# Patient Record
Sex: Female | Born: 1935 | Race: White | Hispanic: No | Smoking: Former smoker
Health system: Southern US, Community
[De-identification: ages and names within clinical notes are randomized; demographics above are authoritative.]

## PROBLEM LIST (undated history)

## (undated) DIAGNOSIS — E785 Hyperlipidemia, unspecified: Secondary | ICD-10-CM

## (undated) DIAGNOSIS — E079 Disorder of thyroid, unspecified: Secondary | ICD-10-CM

## (undated) DIAGNOSIS — S065XAA Traumatic subdural hemorrhage with loss of consciousness status unknown, initial encounter: Secondary | ICD-10-CM

## (undated) DIAGNOSIS — I10 Essential (primary) hypertension: Secondary | ICD-10-CM

## (undated) DIAGNOSIS — I4891 Unspecified atrial fibrillation: Secondary | ICD-10-CM

## (undated) DIAGNOSIS — M1990 Unspecified osteoarthritis, unspecified site: Secondary | ICD-10-CM

## (undated) DIAGNOSIS — S065X9A Traumatic subdural hemorrhage with loss of consciousness of unspecified duration, initial encounter: Secondary | ICD-10-CM

## (undated) HISTORY — PX: JOINT REPLACEMENT: SHX530

## (undated) HISTORY — PX: ABDOMINAL HYSTERECTOMY: SHX81

## (undated) HISTORY — DX: Essential (primary) hypertension: I10

## (undated) HISTORY — PX: TONSILLECTOMY AND ADENOIDECTOMY: SUR1326

## (undated) HISTORY — PX: THYROIDECTOMY, PARTIAL: SHX18

## (undated) HISTORY — PX: TOTAL HIP ARTHROPLASTY: SHX124

## (undated) HISTORY — DX: Unspecified osteoarthritis, unspecified site: M19.90

## (undated) HISTORY — DX: Hyperlipidemia, unspecified: E78.5

## (undated) HISTORY — DX: Disorder of thyroid, unspecified: E07.9

## (undated) HISTORY — PX: TOTAL SHOULDER ARTHROPLASTY: SHX126

## (undated) HISTORY — PX: CRANIOTOMY: SHX93

---

## 1999-11-08 ENCOUNTER — Ambulatory Visit (HOSPITAL_COMMUNITY): Admission: RE | Admit: 1999-11-08 | Discharge: 1999-11-08 | Payer: Self-pay | Admitting: Gastroenterology

## 2000-06-08 LAB — HM COLONOSCOPY

## 2004-05-18 ENCOUNTER — Encounter: Admission: RE | Admit: 2004-05-18 | Discharge: 2004-05-18 | Payer: Self-pay | Admitting: Neurological Surgery

## 2004-05-30 ENCOUNTER — Encounter: Admission: RE | Admit: 2004-05-30 | Discharge: 2004-05-30 | Payer: Self-pay | Admitting: Neurological Surgery

## 2004-06-13 ENCOUNTER — Encounter: Admission: RE | Admit: 2004-06-13 | Discharge: 2004-06-13 | Payer: Self-pay | Admitting: Neurological Surgery

## 2004-07-26 ENCOUNTER — Ambulatory Visit: Payer: Self-pay | Admitting: Internal Medicine

## 2004-12-20 ENCOUNTER — Ambulatory Visit: Payer: Self-pay | Admitting: Specialist

## 2005-09-27 ENCOUNTER — Ambulatory Visit: Payer: Self-pay | Admitting: Internal Medicine

## 2006-04-01 ENCOUNTER — Ambulatory Visit: Payer: Self-pay | Admitting: Ophthalmology

## 2006-04-08 ENCOUNTER — Ambulatory Visit: Payer: Self-pay | Admitting: Ophthalmology

## 2007-01-01 ENCOUNTER — Ambulatory Visit: Payer: Self-pay | Admitting: Internal Medicine

## 2007-01-17 ENCOUNTER — Encounter: Payer: Self-pay | Admitting: Internal Medicine

## 2007-02-01 ENCOUNTER — Emergency Department: Payer: Self-pay | Admitting: Emergency Medicine

## 2007-02-02 ENCOUNTER — Encounter: Payer: Self-pay | Admitting: Internal Medicine

## 2007-03-04 ENCOUNTER — Encounter: Payer: Self-pay | Admitting: Internal Medicine

## 2007-04-04 ENCOUNTER — Encounter: Payer: Self-pay | Admitting: Internal Medicine

## 2007-05-05 ENCOUNTER — Encounter: Payer: Self-pay | Admitting: Internal Medicine

## 2007-06-04 ENCOUNTER — Encounter: Payer: Self-pay | Admitting: Internal Medicine

## 2007-07-05 ENCOUNTER — Encounter: Payer: Self-pay | Admitting: Internal Medicine

## 2007-08-04 ENCOUNTER — Encounter: Payer: Self-pay | Admitting: Internal Medicine

## 2007-09-04 ENCOUNTER — Encounter: Payer: Self-pay | Admitting: Internal Medicine

## 2007-11-02 ENCOUNTER — Encounter: Payer: Self-pay | Admitting: Internal Medicine

## 2007-12-03 ENCOUNTER — Encounter: Payer: Self-pay | Admitting: Internal Medicine

## 2008-01-14 ENCOUNTER — Ambulatory Visit: Payer: Self-pay | Admitting: Internal Medicine

## 2008-02-21 ENCOUNTER — Emergency Department: Payer: Self-pay | Admitting: Emergency Medicine

## 2008-02-21 ENCOUNTER — Other Ambulatory Visit: Payer: Self-pay

## 2008-10-06 ENCOUNTER — Encounter: Payer: Self-pay | Admitting: Internal Medicine

## 2009-01-17 ENCOUNTER — Ambulatory Visit: Payer: Self-pay | Admitting: Internal Medicine

## 2010-01-18 ENCOUNTER — Ambulatory Visit: Payer: Self-pay | Admitting: Internal Medicine

## 2010-02-01 ENCOUNTER — Ambulatory Visit: Payer: Self-pay | Admitting: Specialist

## 2010-03-16 ENCOUNTER — Ambulatory Visit: Payer: Self-pay | Admitting: Pain Medicine

## 2010-03-28 ENCOUNTER — Ambulatory Visit: Payer: Self-pay | Admitting: Pain Medicine

## 2010-04-20 ENCOUNTER — Ambulatory Visit: Payer: Self-pay | Admitting: Pain Medicine

## 2010-10-16 ENCOUNTER — Ambulatory Visit: Payer: Self-pay | Admitting: Pain Medicine

## 2010-10-31 ENCOUNTER — Ambulatory Visit: Payer: Self-pay | Admitting: Pain Medicine

## 2010-11-20 ENCOUNTER — Ambulatory Visit: Payer: Self-pay | Admitting: Pain Medicine

## 2010-12-25 ENCOUNTER — Other Ambulatory Visit: Payer: Self-pay | Admitting: Internal Medicine

## 2010-12-26 ENCOUNTER — Ambulatory Visit: Payer: Self-pay | Admitting: Internal Medicine

## 2011-01-15 ENCOUNTER — Ambulatory Visit: Payer: Self-pay | Admitting: Unknown Physician Specialty

## 2011-01-16 ENCOUNTER — Ambulatory Visit: Payer: Self-pay | Admitting: Unknown Physician Specialty

## 2011-01-19 LAB — PATHOLOGY REPORT

## 2011-02-07 ENCOUNTER — Ambulatory Visit: Payer: Self-pay | Admitting: Internal Medicine

## 2011-12-18 ENCOUNTER — Emergency Department: Payer: Self-pay | Admitting: Emergency Medicine

## 2011-12-18 LAB — URINALYSIS, COMPLETE
Bacteria: NONE SEEN
Bilirubin,UR: NEGATIVE
Blood: NEGATIVE
Glucose,UR: NEGATIVE mg/dL (ref 0–75)
Nitrite: NEGATIVE
Ph: 5 (ref 4.5–8.0)
Protein: NEGATIVE
RBC,UR: 1 /HPF (ref 0–5)
Specific Gravity: 1.018 (ref 1.003–1.030)
Squamous Epithelial: <1
WBC UR: 1 /HPF (ref 0–5)

## 2011-12-18 LAB — CBC
HCT: 38.8 % (ref 35.0–47.0)
HGB: 12.7 g/dL (ref 12.0–16.0)
MCH: 30.9 pg (ref 26.0–34.0)
MCHC: 32.7 g/dL (ref 32.0–36.0)
MCV: 95 fL (ref 80–100)
Platelet: 196 10*3/uL (ref 150–440)
RBC: 4.1 10*6/uL (ref 3.80–5.20)
RDW: 13.2 % (ref 11.5–14.5)
WBC: 8 10*3/uL (ref 3.6–11.0)

## 2011-12-18 LAB — COMPREHENSIVE METABOLIC PANEL
Albumin: 4.3 g/dL (ref 3.4–5.0)
Alkaline Phosphatase: 73 U/L (ref 50–136)
Anion Gap: 10 (ref 7–16)
BUN: 40 mg/dL — ABNORMAL HIGH (ref 7–18)
Bilirubin,Total: 0.6 mg/dL (ref 0.2–1.0)
Calcium, Total: 9.2 mg/dL (ref 8.5–10.1)
Chloride: 104 mmol/L (ref 98–107)
Co2: 26 mmol/L (ref 21–32)
Creatinine: 0.95 mg/dL (ref 0.60–1.30)
EGFR (African American): >60
EGFR (Non-African Amer.): 55 — ABNORMAL LOW
Glucose: 85 mg/dL (ref 65–99)
Osmolality: 288 (ref 275–301)
Potassium: 3.8 mmol/L (ref 3.5–5.1)
SGOT(AST): 41 U/L — ABNORMAL HIGH (ref 15–37)
SGPT (ALT): 24 U/L
Sodium: 140 mmol/L (ref 136–145)
Total Protein: 7.4 g/dL (ref 6.4–8.2)

## 2011-12-18 LAB — PROTIME-INR
INR: 0.9
Prothrombin Time: 12.6 secs (ref 11.5–14.7)

## 2011-12-18 LAB — APTT: Activated PTT: 27.1 secs (ref 23.6–35.9)

## 2011-12-20 LAB — URINE CULTURE

## 2011-12-21 ENCOUNTER — Encounter: Payer: Self-pay | Admitting: Internal Medicine

## 2012-01-02 ENCOUNTER — Encounter: Payer: Self-pay | Admitting: Internal Medicine

## 2012-02-13 ENCOUNTER — Ambulatory Visit: Payer: Self-pay | Admitting: Internal Medicine

## 2013-02-17 ENCOUNTER — Ambulatory Visit: Payer: Self-pay | Admitting: Internal Medicine

## 2013-02-17 LAB — HM MAMMOGRAPHY: HM Mammogram: NORMAL

## 2013-09-03 HISTORY — PX: KYPHOPLASTY: SHX5884

## 2013-10-24 ENCOUNTER — Emergency Department: Payer: Self-pay | Admitting: Emergency Medicine

## 2014-01-11 DIAGNOSIS — M1990 Unspecified osteoarthritis, unspecified site: Secondary | ICD-10-CM | POA: Insufficient documentation

## 2014-01-11 DIAGNOSIS — Z8719 Personal history of other diseases of the digestive system: Secondary | ICD-10-CM | POA: Insufficient documentation

## 2014-01-11 DIAGNOSIS — J42 Unspecified chronic bronchitis: Secondary | ICD-10-CM | POA: Insufficient documentation

## 2014-01-15 ENCOUNTER — Ambulatory Visit: Payer: Self-pay | Admitting: Orthopedic Surgery

## 2014-01-15 LAB — CBC WITH DIFFERENTIAL/PLATELET
Basophil #: 0.1 10*3/uL (ref 0.0–0.1)
Basophil %: 0.5 %
Eosinophil #: 0 10*3/uL (ref 0.0–0.7)
Eosinophil %: 0.3 %
HCT: 41.1 % (ref 35.0–47.0)
HGB: 13 g/dL (ref 12.0–16.0)
Lymphocyte #: 0.6 10*3/uL — ABNORMAL LOW (ref 1.0–3.6)
Lymphocyte %: 5.2 %
MCH: 31.5 pg (ref 26.0–34.0)
MCHC: 31.7 g/dL — ABNORMAL LOW (ref 32.0–36.0)
MCV: 99 fL (ref 80–100)
Monocyte #: 0.7 x10 3/mm (ref 0.2–0.9)
Monocyte %: 5.5 %
Neutrophil #: 10.5 10*3/uL — ABNORMAL HIGH (ref 1.4–6.5)
Neutrophil %: 88.5 %
Platelet: 189 10*3/uL (ref 150–440)
RBC: 4.13 10*6/uL (ref 3.80–5.20)
RDW: 13.6 % (ref 11.5–14.5)
WBC: 11.9 10*3/uL — ABNORMAL HIGH (ref 3.6–11.0)

## 2014-01-15 LAB — BASIC METABOLIC PANEL
Anion Gap: 5 — ABNORMAL LOW (ref 7–16)
BUN: 27 mg/dL — ABNORMAL HIGH (ref 7–18)
Calcium, Total: 9.6 mg/dL (ref 8.5–10.1)
Chloride: 100 mmol/L (ref 98–107)
Co2: 32 mmol/L (ref 21–32)
Creatinine: 0.74 mg/dL (ref 0.60–1.30)
EGFR (African American): >60
EGFR (Non-African Amer.): >60
Glucose: 84 mg/dL (ref 65–99)
Osmolality: 278 (ref 275–301)
Potassium: 3.8 mmol/L (ref 3.5–5.1)
Sodium: 137 mmol/L (ref 136–145)

## 2014-01-19 LAB — PATHOLOGY REPORT

## 2014-01-25 ENCOUNTER — Emergency Department: Payer: Self-pay | Admitting: Emergency Medicine

## 2014-01-29 DIAGNOSIS — K5900 Constipation, unspecified: Secondary | ICD-10-CM | POA: Insufficient documentation

## 2014-01-29 DIAGNOSIS — Z9889 Other specified postprocedural states: Secondary | ICD-10-CM | POA: Insufficient documentation

## 2014-01-29 DIAGNOSIS — S22000A Wedge compression fracture of unspecified thoracic vertebra, initial encounter for closed fracture: Secondary | ICD-10-CM | POA: Insufficient documentation

## 2014-02-09 ENCOUNTER — Ambulatory Visit: Payer: Self-pay | Admitting: Orthopedic Surgery

## 2014-02-10 DIAGNOSIS — E039 Hypothyroidism, unspecified: Secondary | ICD-10-CM

## 2014-02-10 DIAGNOSIS — M8448XD Pathological fracture, other site, subsequent encounter for fracture with routine healing: Secondary | ICD-10-CM

## 2014-02-10 DIAGNOSIS — I10 Essential (primary) hypertension: Secondary | ICD-10-CM

## 2014-02-10 DIAGNOSIS — E785 Hyperlipidemia, unspecified: Secondary | ICD-10-CM

## 2014-02-10 DIAGNOSIS — K5900 Constipation, unspecified: Secondary | ICD-10-CM

## 2014-02-13 ENCOUNTER — Ambulatory Visit: Payer: Self-pay | Admitting: Orthopedic Surgery

## 2014-02-17 DIAGNOSIS — G4700 Insomnia, unspecified: Secondary | ICD-10-CM

## 2014-02-17 DIAGNOSIS — G8929 Other chronic pain: Secondary | ICD-10-CM

## 2014-02-17 DIAGNOSIS — Z9889 Other specified postprocedural states: Secondary | ICD-10-CM

## 2014-03-03 ENCOUNTER — Emergency Department: Payer: Self-pay | Admitting: Emergency Medicine

## 2014-03-12 ENCOUNTER — Emergency Department: Payer: Self-pay | Admitting: Emergency Medicine

## 2014-06-08 ENCOUNTER — Encounter (INDEPENDENT_AMBULATORY_CARE_PROVIDER_SITE_OTHER): Payer: Self-pay

## 2014-06-08 ENCOUNTER — Ambulatory Visit (INDEPENDENT_AMBULATORY_CARE_PROVIDER_SITE_OTHER): Payer: Medicare Other | Admitting: Internal Medicine

## 2014-06-08 ENCOUNTER — Encounter: Payer: Self-pay | Admitting: Internal Medicine

## 2014-06-08 VITALS — BP 132/82 | HR 82 | Temp 98.0°F | Resp 14 | Ht 60.5 in | Wt 97.5 lb

## 2014-06-08 DIAGNOSIS — G450 Vertebro-basilar artery syndrome: Secondary | ICD-10-CM | POA: Insufficient documentation

## 2014-06-08 DIAGNOSIS — Z23 Encounter for immunization: Secondary | ICD-10-CM

## 2014-06-08 DIAGNOSIS — R591 Generalized enlarged lymph nodes: Secondary | ICD-10-CM | POA: Insufficient documentation

## 2014-06-08 DIAGNOSIS — S065X9A Traumatic subdural hemorrhage with loss of consciousness of unspecified duration, initial encounter: Secondary | ICD-10-CM | POA: Insufficient documentation

## 2014-06-08 DIAGNOSIS — I10 Essential (primary) hypertension: Secondary | ICD-10-CM | POA: Insufficient documentation

## 2014-06-08 DIAGNOSIS — R0681 Apnea, not elsewhere classified: Secondary | ICD-10-CM | POA: Insufficient documentation

## 2014-06-08 DIAGNOSIS — R5383 Other fatigue: Secondary | ICD-10-CM

## 2014-06-08 DIAGNOSIS — R002 Palpitations: Secondary | ICD-10-CM

## 2014-06-08 DIAGNOSIS — E038 Other specified hypothyroidism: Secondary | ICD-10-CM

## 2014-06-08 DIAGNOSIS — E785 Hyperlipidemia, unspecified: Secondary | ICD-10-CM | POA: Insufficient documentation

## 2014-06-08 DIAGNOSIS — I4891 Unspecified atrial fibrillation: Secondary | ICD-10-CM | POA: Insufficient documentation

## 2014-06-08 DIAGNOSIS — M810 Age-related osteoporosis without current pathological fracture: Secondary | ICD-10-CM | POA: Insufficient documentation

## 2014-06-08 DIAGNOSIS — R599 Enlarged lymph nodes, unspecified: Secondary | ICD-10-CM | POA: Insufficient documentation

## 2014-06-08 DIAGNOSIS — J309 Allergic rhinitis, unspecified: Secondary | ICD-10-CM | POA: Insufficient documentation

## 2014-06-08 DIAGNOSIS — Z79899 Other long term (current) drug therapy: Secondary | ICD-10-CM

## 2014-06-08 DIAGNOSIS — S065XAA Traumatic subdural hemorrhage with loss of consciousness status unknown, initial encounter: Secondary | ICD-10-CM | POA: Insufficient documentation

## 2014-06-08 DIAGNOSIS — R64 Cachexia: Secondary | ICD-10-CM

## 2014-06-08 DIAGNOSIS — S065X0D Traumatic subdural hemorrhage without loss of consciousness, subsequent encounter: Secondary | ICD-10-CM

## 2014-06-08 NOTE — Patient Instructions (Signed)
It was nice to meet you today.  We will set up an evaluation with Dr. Gwen PoundsKowalski in cardiology.  We will check blood work today.  Follow up in 2 weeks.

## 2014-06-08 NOTE — Assessment & Plan Note (Signed)
Will check CMP, CBC, TSH with labs today. EKG normal today. Will set up cardiology evaluation with Dr. Gwen PoundsKowalski for possible Holter. Question if she may be having runs of AFIB.

## 2014-06-08 NOTE — Assessment & Plan Note (Signed)
Recent weight loss and apathy. Will check TSH with labs today. Continue Levothyroxine.

## 2014-06-08 NOTE — Assessment & Plan Note (Signed)
BP Readings from Last 3 Encounters:  06/08/14 132/82   BP well controlled on current medication. Renal function with labs today.

## 2014-06-08 NOTE — Assessment & Plan Note (Signed)
Recent fatigue and apathy. Question if depression may be playing a role, however she denies this. Will check CBC, CMP, TSH, B12 with labs. Follow up in 2-4 weeks.

## 2014-06-08 NOTE — Assessment & Plan Note (Signed)
Reviewed history of SDH with pt today. No recent falls and falls prevention in place in her home. Will continue to monitor.

## 2014-06-08 NOTE — Progress Notes (Signed)
Pre visit review using our clinic review tool, if applicable. No additional management support is needed unless otherwise documented below in the visit note. 

## 2014-06-08 NOTE — Assessment & Plan Note (Signed)
Recent weight loss noted by family. Will check CBC, TSH, CMP with labs. Suspect depression playing a role, however pt denies this. Will have her follow up in 2 weeks.

## 2014-06-08 NOTE — Progress Notes (Signed)
Subjective:    Patient ID: Elizabeth Horne, female    DOB: 25-Oct-1925, 80 y.o.   MRN: [id number]  HPI 80YO female presents to establish care.  Difficult time for her recently. Had kyphoplasty this summer. Has been having 10 hours of assistance daily with ADLs. Lives in [place] at [employer]. Has not left home since July. Denies any pain in her back after her procedures. Denies sadness. However notes lack of interest in usual activities. Concerned about risk of falling and reports that this fear limits her from wanting to leave home.. No recent falls. Falls prevention in place at home including safety bars, removal of rugs. Poor appetite this summer. Appetite improving. Prior to surgery this summer, normal weight 110-112.  Had a SDH several years ago. Had to have surgical intervention with craniotomy. No residual sequelae from this.  Palpitations - having some palpitations and fluttering with exertion over last few months. Mild shortness of breath during these episodes. No chest pain. Lasts a few minutes then gone without intervention.  Review of Systems  Constitutional: Positive for activity change and appetite change. Negative for fever, chills, fatigue and unexpected weight change.  Eyes: Negative for visual disturbance.  Respiratory: Positive for shortness of breath.   Cardiovascular: Positive for palpitations. Negative for chest pain and leg swelling.  Gastrointestinal: Negative for nausea, vomiting, abdominal pain, diarrhea and constipation.  Skin: Negative for color change and rash.  Hematological: Negative for adenopathy. Does not bruise/bleed easily.  Psychiatric/Behavioral: Negative for sleep disturbance, dysphoric mood and decreased concentration. The patient is not nervous/anxious.        Objective:    BP 132/82  Pulse 82  Temp(Src) 98 F (36.7 C) (Oral)  Resp 14  Ht 5' 0.5" (1.537 m)  Wt 97 lb 8 oz (44.226 kg)  BMI 18.72 kg/m2  SpO2 95% Physical  Exam  Constitutional: She is oriented to person, place, and time. She appears well-developed and well-nourished. No distress.  HENT:  Head: Normocephalic and atraumatic.  Right Ear: External ear normal.  Left Ear: External ear normal.  Nose: Nose normal.  Mouth/Throat: Oropharynx is clear and moist. No oropharyngeal exudate.  Eyes: Conjunctivae and EOM are normal. Pupils are equal, round, and reactive to light. Right eye exhibits no discharge. Left eye exhibits no discharge. No scleral icterus.  Neck: Normal range of motion. Neck supple. No tracheal deviation present. No thyromegaly present.  Cardiovascular: Normal rate, regular rhythm, normal heart sounds and intact distal pulses.  Exam reveals no gallop and no friction rub.   No murmur heard. Pulmonary/Chest: Effort normal and breath sounds normal. No accessory muscle usage. Not tachypneic. No respiratory distress. She has no decreased breath sounds. She has no wheezes. She has no rhonchi. She has no rales. She exhibits no tenderness.  Abdominal: Soft. Bowel sounds are normal. She exhibits no distension and no mass. There is no tenderness. There is no rebound and no guarding.  Musculoskeletal: Normal range of motion. She exhibits no edema and no tenderness.  Lymphadenopathy:    She has no cervical adenopathy.  Neurological: She is alert and oriented to person, place, and time. No cranial nerve deficit. She exhibits normal muscle tone. Coordination normal.  Skin: Skin is warm and dry. No rash noted. She is not diaphoretic. No erythema. No pallor.  Psychiatric: She has a normal mood and affect. Her behavior is normal. Judgment and thought content normal.          Assessment & Plan:  Problem List Items Addressed This Visit     Unprioritized   BP (high blood pressure) - Primary      BP Readings from Last 3 Encounters:  06/08/14 132/82   BP well controlled on current medication. Renal function with labs today.    Relevant  Medications      aspirin 81 MG tablet      losartan-hydrochlorothiazide (HYZAAR) 50-12.5 MG per tablet      simvastatin (ZOCOR) 40 MG tablet   Other Relevant Orders      Comprehensive metabolic panel      Lipid Profile   Cachexia     Recent weight loss noted by family. Will check CBC, TSH, CMP with labs. Suspect depression playing a role, however pt denies this. Will have her follow up in 2 weeks.    Hypothyroidism, secondary     Recent weight loss and apathy. Will check TSH with labs today. Continue Levothyroxine.    Relevant Medications      levothyroxine (SYNTHROID, LEVOTHROID) 75 MCG tablet   Other Relevant Orders      TSH   Intracranial subdural hematoma     Reviewed history of SDH with pt today. No recent falls and falls prevention in place in her home. Will continue to monitor.    Other fatigue     Recent fatigue and apathy. Question if depression may be playing a role, however she denies this. Will check CBC, CMP, TSH, B12 with labs. Follow up in 2-4 weeks.    Relevant Orders      B12   Palpitations     Will check CMP, CBC, TSH with labs today. EKG normal today. Will set up cardiology evaluation with Dr. Gwen PoundsKowalski for possible Holter. Question if she may be having runs of AFIB.    Relevant Orders      EKG 12-Lead (Completed)      Ambulatory referral to Cardiology    Other Visit Diagnoses   Need for prophylactic vaccination against Streptococcus pneumoniae (pneumococcus)        Relevant Orders       Pneumococcal conjugate vaccine 13-valent (Completed)    Encounter for immunization            Return in about 2 weeks (around 06/22/2014) for Recheck.

## 2014-06-09 ENCOUNTER — Other Ambulatory Visit: Payer: Self-pay | Admitting: Internal Medicine

## 2014-06-09 ENCOUNTER — Telehealth: Payer: Self-pay | Admitting: Family Medicine

## 2014-06-09 LAB — COMPREHENSIVE METABOLIC PANEL
ALT: 15 U/L (ref 0–35)
AST: 32 U/L (ref 0–37)
Albumin: 3.6 g/dL (ref 3.5–5.2)
Alkaline Phosphatase: 63 U/L (ref 39–117)
BUN: 25 mg/dL — ABNORMAL HIGH (ref 6–23)
CO2: 27 mEq/L (ref 19–32)
Calcium: 9.5 mg/dL (ref 8.4–10.5)
Chloride: 103 mEq/L (ref 96–112)
Creatinine, Ser: 1.1 mg/dL (ref 0.4–1.2)
GFR: 52.82 mL/min — ABNORMAL LOW (ref >60.00)
Glucose, Bld: 82 mg/dL (ref 70–99)
Potassium: 5 mEq/L (ref 3.5–5.1)
Sodium: 140 mEq/L (ref 135–145)
Total Bilirubin: 0.5 mg/dL (ref 0.2–1.2)
Total Protein: 6.8 g/dL (ref 6.0–8.3)

## 2014-06-09 LAB — LIPID PANEL
Cholesterol: 155 mg/dL (ref 0–200)
HDL: 76.7 mg/dL (ref >39.00)
LDL Cholesterol: 69 mg/dL (ref 0–99)
NonHDL: 78.3
Total CHOL/HDL Ratio: 2
Triglycerides: 49 mg/dL (ref 0.0–149.0)
VLDL: 9.8 mg/dL (ref 0.0–40.0)

## 2014-06-09 LAB — VITAMIN B12: Vitamin B-12: 545 pg/mL (ref 211–911)

## 2014-06-09 LAB — TSH: TSH: 0.5 u[IU]/mL (ref 0.35–4.50)

## 2014-06-09 NOTE — Telephone Encounter (Signed)
emmi mailed  °

## 2014-06-10 ENCOUNTER — Encounter: Payer: Self-pay | Admitting: *Deleted

## 2014-07-07 ENCOUNTER — Ambulatory Visit (INDEPENDENT_AMBULATORY_CARE_PROVIDER_SITE_OTHER): Payer: Medicare Other | Admitting: Internal Medicine

## 2014-07-07 ENCOUNTER — Encounter: Payer: Self-pay | Admitting: Internal Medicine

## 2014-07-07 VITALS — BP 142/92 | HR 72 | Temp 97.9°F | Ht 60.5 in | Wt 96.0 lb

## 2014-07-07 DIAGNOSIS — F4323 Adjustment disorder with mixed anxiety and depressed mood: Secondary | ICD-10-CM | POA: Insufficient documentation

## 2014-07-07 DIAGNOSIS — E538 Deficiency of other specified B group vitamins: Secondary | ICD-10-CM

## 2014-07-07 DIAGNOSIS — R64 Cachexia: Secondary | ICD-10-CM

## 2014-07-07 MED ORDER — CYANOCOBALAMIN 1000 MCG/ML IJ SOLN
1000.0000 ug | Freq: Once | INTRAMUSCULAR | Status: AC
  Administered 2014-07-07: 1000 ug via INTRAMUSCULAR

## 2014-07-07 MED ORDER — MIRTAZAPINE 7.5 MG PO TABS: 7.5000 mg | ORAL_TABLET | Freq: Every day | ORAL | Status: DC

## 2014-07-07 NOTE — Progress Notes (Signed)
Subjective:    Patient ID: Elizabeth Horne, female    DOB: November 08, 1925, 80 y.o.   MRN: [id number]  HPI 80YO female presents for follow up.  Initially presented to establish care 06/08/2014.  Was concerned about weight loss. Labs including thyroid function, B12, CMP were normal. Family was concerned about depression, however pt denied symptoms.  Wt Readings from Last 3 Encounters:  07/07/14 96 lb (43.545 kg)  06/08/14 97 lb 8 oz (44.226 kg)   Pt reports good appetite. Energy level is low. Breakfast - egg with toast or cereal Lunch - sandwich, carrots, corn, salad Dinner - meat with veggies Rarely uses Boost or Ensure.  Caregiver notes that she sometimes seems down or depressed. Pt denies feeling sad, however it has been a difficult summer after her surgery. She is sleeping well. No NVD.   Review of Systems  Constitutional: Negative for fever, chills, appetite change, fatigue and unexpected weight change.  HENT: Negative for trouble swallowing and voice change.   Eyes: Negative for visual disturbance.  Respiratory: Negative for shortness of breath.   Cardiovascular: Negative for chest pain and leg swelling.  Gastrointestinal: Negative for nausea, vomiting, abdominal pain, diarrhea, constipation and blood in stool.  Skin: Negative for color change and rash.  Hematological: Negative for adenopathy. Does not bruise/bleed easily.  Psychiatric/Behavioral: Positive for dysphoric mood. Negative for sleep disturbance. The patient is not nervous/anxious.        Objective:    BP 142/92 mmHg  Pulse 72  Temp(Src) 97.9 F (36.6 C) (Oral)  Ht 5' 0.5" (1.537 m)  Wt 96 lb (43.545 kg)  BMI 18.43 kg/m2  SpO2 92% Physical Exam  Constitutional: She is oriented to person, place, and time. She appears well-developed and well-nourished. No distress.  HENT:  Head: Normocephalic and atraumatic.  Right Ear: External ear normal.  Left Ear: External ear normal.  Nose: Nose normal.    Mouth/Throat: Oropharynx is clear and moist. No oropharyngeal exudate.  Eyes: Conjunctivae are normal. Pupils are equal, round, and reactive to light. Right eye exhibits no discharge. Left eye exhibits no discharge. No scleral icterus.  Neck: Normal range of motion. Neck supple. No tracheal deviation present. No thyromegaly present.  Cardiovascular: Normal rate, regular rhythm, normal heart sounds and intact distal pulses.  Exam reveals no gallop and no friction rub.   No murmur heard. Pulmonary/Chest: Effort normal and breath sounds normal. No accessory muscle usage. No tachypnea. No respiratory distress. She has no decreased breath sounds. She has no wheezes. She has no rhonchi. She has no rales. She exhibits no tenderness.  Musculoskeletal: Normal range of motion. She exhibits no edema or tenderness.  Lymphadenopathy:    She has no cervical adenopathy.  Neurological: She is alert and oriented to person, place, and time. No cranial nerve deficit. She exhibits normal muscle tone. Coordination normal.  Skin: Skin is warm and dry. No rash noted. She is not diaphoretic. No erythema. No pallor.  Psychiatric: She has a normal mood and affect. Her behavior is normal. Judgment and thought content normal.          Assessment & Plan:   Problem List Items Addressed This Visit      Unprioritized   Adjustment disorder with mixed anxiety and depressed mood    Difficult time for pt recently after surgery this summer. Isolated in her home since July. She denies depression, however caregivers and family have noted apathy and depressed mood. Will start Mirtazapine to help with depressed mood  and also with appetite. Follow up in 4 weeks and prn.    Cachexia - Primary    Wt Readings from Last 3 Encounters:  07/07/14 96 lb (43.545 kg)  06/08/14 97 lb 8 oz (44.226 kg)   Recent labs normal. Encouraged high calorie diet. Suspect depression playing a role. Will start Mirtazapine. Follow up in 4 weeks and  as needed.    Relevant Medications      Mirtazapine (REMERON) tablet       Return in about 4 weeks (around 08/04/2014).

## 2014-07-07 NOTE — Assessment & Plan Note (Signed)
Wt Readings from Last 3 Encounters:  07/07/14 96 lb (43.545 kg)  06/08/14 97 lb 8 oz (44.226 kg)   Recent labs normal. Encouraged high calorie diet. Suspect depression playing a role. Will start Mirtazapine. Follow up in 4 weeks and as needed.

## 2014-07-07 NOTE — Progress Notes (Signed)
Pre visit review using our clinic review tool, if applicable. No additional management support is needed unless otherwise documented below in the visit note. 

## 2014-07-07 NOTE — Addendum Note (Signed)
Addended by: Marchia MeiersEASTWOOD, Osha Rane M on: 07/07/2014 12:09 PM   Modules accepted: Orders

## 2014-07-07 NOTE — Assessment & Plan Note (Signed)
Difficult time for pt recently after surgery this summer. Isolated in her home since July. She denies depression, however caregivers and family have noted apathy and depressed mood. Will start Mirtazapine to help with depressed mood and also with appetite. Follow up in 4 weeks and prn.

## 2014-07-07 NOTE — Patient Instructions (Addendum)
Start Mirtazapine 7.5mg  daily at bedtime to help with appetite.  Follow up in 4 weeks or sooner as needed.

## 2014-08-09 ENCOUNTER — Telehealth: Payer: Self-pay | Admitting: *Deleted

## 2014-08-09 NOTE — Telephone Encounter (Signed)
That is fine 

## 2014-08-09 NOTE — Telephone Encounter (Signed)
Pt is wanting Pharmacy to give B12 injections to her.  This okay?

## 2014-08-18 ENCOUNTER — Telehealth: Payer: Self-pay | Admitting: *Deleted

## 2014-08-18 NOTE — Telephone Encounter (Signed)
Pt daughter-in-law called to let you know that her and her husband, Raiford NobleRick, are very concerned about her short term memory.  They tell her things often and a few min later she is asking the same questions, they are concerned about early dementia.  Pt has appt tomorrow

## 2014-08-19 ENCOUNTER — Ambulatory Visit (INDEPENDENT_AMBULATORY_CARE_PROVIDER_SITE_OTHER): Payer: Medicare Other | Admitting: Internal Medicine

## 2014-08-19 ENCOUNTER — Encounter: Payer: Self-pay | Admitting: Internal Medicine

## 2014-08-19 VITALS — BP 142/88 | HR 79 | Temp 98.1°F | Resp 14 | Ht 60.5 in | Wt 100.2 lb

## 2014-08-19 DIAGNOSIS — I10 Essential (primary) hypertension: Secondary | ICD-10-CM

## 2014-08-19 DIAGNOSIS — R413 Other amnesia: Secondary | ICD-10-CM

## 2014-08-19 DIAGNOSIS — R64 Cachexia: Secondary | ICD-10-CM

## 2014-08-19 NOTE — Progress Notes (Signed)
Subjective:    Patient ID: Elizabeth Horne, female    DOB: [birth year], 80 y.o.   MRN: [id number]  HPI 80YO female presents for follow up.  Last visit 07/07/2014 started Mirtazapine to help with depression and appetite. Family called yesterday, and have been concerned about memory loss and dementia.  Wt Readings from Last 3 Encounters:  08/19/14 100 lb 4 oz (45.473 kg)  07/07/14 96 lb (43.545 kg)  06/08/14 97 lb 8 oz (44.226 kg)   Energy level much improved over last few weeks. Appetite improved. Bowels regular. Continues to have intermittent episodes of lightheadedness on occasion, but feels that this is improving. No headache. No nausea or vomiting.  Took medicine right before visit. No chest pain, palpitations.   BP Readings from Last 3 Encounters:  08/19/14 142/88  07/07/14 142/92  06/08/14 132/82   Memory - feels that it is not "quite as sharp." However, manages finances at home. She is not interested in further testing of her memory or medication to slow memory loss at this point.   Past medical, surgical, family and social history per today's encounter.  Review of Systems  Constitutional: Negative for fever, chills, appetite change, fatigue and unexpected weight change.  Eyes: Negative for visual disturbance.  Respiratory: Negative for shortness of breath.   Cardiovascular: Negative for chest pain and leg swelling.  Gastrointestinal: Negative for nausea, vomiting, abdominal pain, diarrhea and constipation.  Musculoskeletal: Negative for myalgias and arthralgias.  Skin: Negative for color change and rash.  Neurological: Positive for light-headedness (intermittent). Negative for syncope and weakness.  Hematological: Negative for adenopathy. Does not bruise/bleed easily.  Psychiatric/Behavioral: Negative for sleep disturbance and dysphoric mood. The patient is not nervous/anxious.        Objective:    BP 142/88 mmHg  Pulse 79  Temp(Src) 98.1 F (36.7 C) (Oral)   Resp 14  Ht 5' 0.5" (1.537 m)  Wt 100 lb 4 oz (45.473 kg)  BMI 19.25 kg/m2  SpO2 94% Physical Exam  Constitutional: She is oriented to person, place, and time. She appears well-developed and well-nourished. No distress.  HENT:  Head: Normocephalic and atraumatic.  Right Ear: External ear normal.  Left Ear: External ear normal.  Nose: Nose normal.  Mouth/Throat: Oropharynx is clear and moist. No oropharyngeal exudate.  Eyes: Conjunctivae are normal. Pupils are equal, round, and reactive to light. Right eye exhibits no discharge. Left eye exhibits no discharge. No scleral icterus.  Neck: Normal range of motion. Neck supple. No tracheal deviation present. No thyromegaly present.  Cardiovascular: Normal rate, regular rhythm, normal heart sounds and intact distal pulses.  Exam reveals no gallop and no friction rub.   No murmur heard. Pulmonary/Chest: Effort normal and breath sounds normal. No accessory muscle usage. No tachypnea. No respiratory distress. She has no decreased breath sounds. She has no wheezes. She has no rhonchi. She has no rales. She exhibits no tenderness.  Musculoskeletal: Normal range of motion. She exhibits no edema or tenderness.  Lymphadenopathy:    She has no cervical adenopathy.  Neurological: She is alert and oriented to person, place, and time. No cranial nerve deficit. She exhibits normal muscle tone. Coordination normal.  Skin: Skin is warm and dry. No rash noted. She is not diaphoretic. No erythema. No pallor.  Psychiatric: She has a normal mood and affect. Her behavior is normal. Judgment and thought content normal.          Assessment & Plan:   Problem List Items Addressed This  Visit      Unprioritized   BP (high blood pressure)    BP Readings from Last 3 Encounters:  08/19/14 142/88  07/07/14 142/92  06/08/14 132/82   BP elevated initially today. Will continue to monitor for now. Continue Losartan-HCTZ. Repeat renal function with labs in 3  months.    Cachexia    Wt Readings from Last 3 Encounters:  08/19/14 100 lb 4 oz (45.473 kg)  07/07/14 96 lb (43.545 kg)  06/08/14 97 lb 8 oz (44.226 kg)   Body mass index is 19.25 kg/(m^2). Weight gain is encouraging. Pt reports appetite is improving. Will continue to monitor with follow up in 3 months.    Memory loss - Primary    Pt notes some short term memory loss. We discussed further testing of this with evaluation with cognitive testing. She is not interested in pursing this. We also discussed medication to slow memory loss. She does not want to add medications at this point. Note that TSH and B12 were normal.        Return in about 3 months (around 11/18/2014) for Recheck.

## 2014-08-19 NOTE — Patient Instructions (Signed)
Follow up in 3 months or sooner as needed. 

## 2014-08-19 NOTE — Assessment & Plan Note (Signed)
Pt notes some short term memory loss. We discussed further testing of this with evaluation with cognitive testing. She is not interested in pursing this. We also discussed medication to slow memory loss. She does not want to add medications at this point. Note that TSH and B12 were normal.

## 2014-08-19 NOTE — Assessment & Plan Note (Signed)
BP Readings from Last 3 Encounters:  08/19/14 142/88  07/07/14 142/92  06/08/14 132/82   BP elevated initially today. Will continue to monitor for now. Continue Losartan-HCTZ. Repeat renal function with labs in 3 months.

## 2014-08-19 NOTE — Progress Notes (Signed)
Pre visit review using our clinic review tool, if applicable. No additional management support is needed unless otherwise documented below in the visit note. 

## 2014-08-19 NOTE — Assessment & Plan Note (Signed)
Wt Readings from Last 3 Encounters:  08/19/14 100 lb 4 oz (45.473 kg)  07/07/14 96 lb (43.545 kg)  06/08/14 97 lb 8 oz (44.226 kg)   Body mass index is 19.25 kg/(m^2). Weight gain is encouraging. Pt reports appetite is improving. Will continue to monitor with follow up in 3 months.

## 2014-08-20 ENCOUNTER — Ambulatory Visit: Payer: Medicare Other | Admitting: Internal Medicine

## 2014-08-30 ENCOUNTER — Other Ambulatory Visit: Payer: Self-pay | Admitting: Internal Medicine

## 2014-09-07 ENCOUNTER — Other Ambulatory Visit: Payer: Self-pay | Admitting: Internal Medicine

## 2014-09-07 NOTE — Telephone Encounter (Signed)
Please advise Rx.  Appears to be a new Rx.

## 2014-11-18 ENCOUNTER — Ambulatory Visit: Payer: Medicare Other | Admitting: Internal Medicine

## 2014-11-18 ENCOUNTER — Encounter: Payer: Self-pay | Admitting: Internal Medicine

## 2014-11-18 ENCOUNTER — Ambulatory Visit (INDEPENDENT_AMBULATORY_CARE_PROVIDER_SITE_OTHER): Payer: Self-pay | Admitting: Internal Medicine

## 2014-11-18 VITALS — BP 126/72 | HR 81 | Temp 98.1°F | Ht 60.5 in | Wt 102.5 lb

## 2014-11-18 DIAGNOSIS — E038 Other specified hypothyroidism: Secondary | ICD-10-CM

## 2014-11-18 DIAGNOSIS — R64 Cachexia: Secondary | ICD-10-CM

## 2014-11-18 DIAGNOSIS — E785 Hyperlipidemia, unspecified: Secondary | ICD-10-CM

## 2014-11-18 DIAGNOSIS — I10 Essential (primary) hypertension: Secondary | ICD-10-CM

## 2014-11-18 LAB — COMPREHENSIVE METABOLIC PANEL
ALT: 12 U/L (ref 0–35)
AST: 25 U/L (ref 0–37)
Albumin: 4.4 g/dL (ref 3.5–5.2)
Alkaline Phosphatase: 74 U/L (ref 39–117)
BUN: 39 mg/dL — ABNORMAL HIGH (ref 6–23)
CO2: 33 mEq/L — ABNORMAL HIGH (ref 19–32)
Calcium: 9.7 mg/dL (ref 8.4–10.5)
Chloride: 101 mEq/L (ref 96–112)
Creatinine, Ser: 1.18 mg/dL (ref 0.40–1.20)
GFR: 46.12 mL/min — ABNORMAL LOW (ref >60.00)
Glucose, Bld: 106 mg/dL — ABNORMAL HIGH (ref 70–99)
Potassium: 4.8 mEq/L (ref 3.5–5.1)
Sodium: 138 mEq/L (ref 135–145)
Total Bilirubin: 0.6 mg/dL (ref 0.2–1.2)
Total Protein: 6.7 g/dL (ref 6.0–8.3)

## 2014-11-18 NOTE — Progress Notes (Signed)
Pre visit review using our clinic review tool, if applicable. No additional management support is needed unless otherwise documented below in the visit note. 

## 2014-11-18 NOTE — Assessment & Plan Note (Signed)
Will check TSH with labs. Continue Levothyroxine. 

## 2014-11-18 NOTE — Progress Notes (Signed)
Subjective:    Patient ID: Elizabeth Horne, female    DOB: 1926/03/19, 81 y.o.   MRN: [id number]  HPI  81YO female presents for follow up.  Feeling well. Caregiver reports she has excellent appetite, eating well rounded meals with vegetables including spinach and kale, meat.   Her energy level has been good. She denies any concerns today. She is compliant with her medications.  Wt Readings from Last 3 Encounters:  11/18/14 102 lb 8 oz (46.494 kg)  08/19/14 100 lb 4 oz (45.473 kg)  07/07/14 96 lb (43.545 kg)    Past medical, surgical, family and social history per today's encounter.  Review of Systems  Constitutional: Negative for fever, chills, appetite change, fatigue and unexpected weight change.  HENT: Negative for congestion, postnasal drip, rhinorrhea, trouble swallowing and voice change.   Eyes: Negative for visual disturbance.  Respiratory: Negative for shortness of breath.   Cardiovascular: Negative for chest pain and leg swelling.  Gastrointestinal: Negative for nausea, vomiting, abdominal pain, diarrhea, constipation and blood in stool.  Musculoskeletal: Negative for myalgias and arthralgias.  Skin: Negative for color change and rash.  Neurological: Negative for dizziness, weakness, numbness and headaches.  Hematological: Negative for adenopathy. Does not bruise/bleed easily.  Psychiatric/Behavioral: Negative for sleep disturbance and dysphoric mood. The patient is not nervous/anxious.        Objective:    BP 126/72 mmHg  Pulse 81  Temp(Src) 98.1 F (36.7 C) (Oral)  Ht 5' 0.5" (1.537 m)  Wt 102 lb 8 oz (46.494 kg)  BMI 19.68 kg/m2  SpO2 96% Physical Exam  Constitutional: She is oriented to person, place, and time. She appears well-developed and well-nourished. No distress.  HENT:  Head: Normocephalic and atraumatic.  Right Ear: External ear normal.  Left Ear: External ear normal.  Nose: Nose normal.  Mouth/Throat: Oropharynx is clear and moist. No  oropharyngeal exudate.  Eyes: Conjunctivae are normal. Pupils are equal, round, and reactive to light. Right eye exhibits no discharge. Left eye exhibits no discharge. No scleral icterus.  Neck: Normal range of motion. Neck supple. No tracheal deviation present. No thyromegaly present.  Cardiovascular: Normal rate, regular rhythm, normal heart sounds and intact distal pulses.  Exam reveals no gallop and no friction rub.   No murmur heard. Pulmonary/Chest: Effort normal and breath sounds normal. No respiratory distress. She has no wheezes. She has no rales. She exhibits no tenderness.  Musculoskeletal: Normal range of motion. She exhibits no edema or tenderness.  Lymphadenopathy:    She has no cervical adenopathy.  Neurological: She is alert and oriented to person, place, and time. No cranial nerve deficit. She exhibits normal muscle tone. Coordination normal.  Skin: Skin is warm and dry. No rash noted. She is not diaphoretic. No erythema. No pallor.  Psychiatric: She has a normal mood and affect. Her behavior is normal. Judgment and thought content normal.          Assessment & Plan:   Problem List Items Addressed This Visit      Unprioritized   BP (high blood pressure) - Primary    BP Readings from Last 3 Encounters:  11/18/14 126/72  08/19/14 142/88  07/07/14 142/92   BP well controlled. Renal function with labs. Continue Losartant-HCTZ.      Relevant Orders   Comprehensive metabolic panel   Cachexia    Wt Readings from Last 3 Encounters:  11/18/14 102 lb 8 oz (46.494 kg)  08/19/14 100 lb 4 oz (45.473 kg)  07/07/14 96 lb (43.545 kg)   Weight is improving. Encouraged healthy, well-rounded diet and adequate intake of dietary calcium. Follow up in 6 months and prn.      HLD (hyperlipidemia)    Will check LFTs with labs. Continue Simvastatin.      Hypothyroidism, secondary    Will check TSH with labs. Continue Levothyroxine.      Relevant Orders   TSH        Return in about 6 months (around 05/21/2015) for Wellness Visit.

## 2014-11-18 NOTE — Assessment & Plan Note (Signed)
Will check LFTs with labs. Continue Simvastatin. 

## 2014-11-18 NOTE — Patient Instructions (Addendum)
Labs today.  Follow up in 6 months. 

## 2014-11-18 NOTE — Assessment & Plan Note (Signed)
BP Readings from Last 3 Encounters:  11/18/14 126/72  08/19/14 142/88  07/07/14 142/92   BP well controlled. Renal function with labs. Continue Losartant-HCTZ.

## 2014-11-18 NOTE — Assessment & Plan Note (Signed)
Wt Readings from Last 3 Encounters:  11/18/14 102 lb 8 oz (46.494 kg)  08/19/14 100 lb 4 oz (45.473 kg)  07/07/14 96 lb (43.545 kg)   Weight is improving. Encouraged healthy, well-rounded diet and adequate intake of dietary calcium. Follow up in 6 months and prn.

## 2014-11-19 ENCOUNTER — Encounter: Payer: Self-pay | Admitting: *Deleted

## 2014-11-19 LAB — TSH: TSH: 2.63 u[IU]/mL (ref 0.35–4.50)

## 2014-12-25 NOTE — Op Note (Signed)
PATIENT NAME:  Elizabeth Horne, Elizabeth Horne MR#:  [id number] DATE OF BIRTH:  1926/05/20  DATE OF PROCEDURE:  02/09/2014  PREOPERATIVE DIAGNOSIS: T8 compression fracture.   POSTOPERATIVE DIAGNOSIS: T8 compression fracture.   PROCEDURE: Kyphoplasty T8.   ANESTHESIA: MAC.   SURGEON: Leitha SchullerMichael J. Eldrige Pitkin, MD   DESCRIPTION OF PROCEDURE: The patient was brought to the operating room and after adequate anesthesia was obtained, the patient was placed in a prone position. The C-arm was brought in and good visualization of T8 was obtained. Patient identification and timeout procedures were completed, and 10 mL of 1% Xylocaine was infiltrated in the area of the planned incisions. The back was then prepped and draped in the usual sterile fashion. Repeat timeout procedure carried out. Spinal needle was then used to inject down to the facet joint on the right initially. A small stab incision was made and trocar used to get into the vertebral body. Drilling was carried out under fluoroscopy, as there was significant compression with vertebra plana that had progressed since her preop x-rays on 01/25/2014. A balloon was inserted and inflated to 2.5 mL. Cement was mixed and then the balloon was removed. There had been partial correction of her deformity. The cement was inserted, with approximately 2.5 mL placed with good interdigitation and good fill. Postprocedure x-rays were obtained. The wound was closed with Dermabond and then covered with a Band-Aid. The patient was sent to the recovery room in stable condition. No biopsy was obtained, as there had been a recent MRI with normal signal in the vertebral body.   SPECIMEN: None.   COMPLICATIONS: None.  ESTIMATED BLOOD LOSS: 25 mL.   CONDITION: To recovery room stable.    ____________________________ Leitha SchullerMichael J. Grady Lucci, MD mjm:jcm D: 02/09/2014 20:12:15 ET T: 02/09/2014 21:48:38 ET JOB#: [id number]  cc: Leitha SchullerMichael J. Garnett Rekowski, MD, <Dictator> Leitha SchullerMICHAEL J Lukus Binion MD ELECTRONICALLY SIGNED  02/10/2014 8:10

## 2014-12-25 NOTE — Op Note (Signed)
PATIENT NAME:  Elizabeth Horne, Elizabeth Horne MR#:  [id number] DATE OF BIRTH:  1925/12/25  DATE OF PROCEDURE:  01/15/2014  PREOPERATIVE DIAGNOSIS: T7 compression fracture.   POSTOPERATIVE DIAGNOSIS: T7 compression fracture.   PROCEDURE: T7 kyphoplasty.   ANESTHESIA: MAC.   SURGEON: Leitha SchullerMichael J. Yailene Badia, MD  DESCRIPTION OF PROCEDURE: The patient was brought to the operating room, and after adequate anesthesia was obtained, C-arm was brought in, and the T7 compression fracture was well visualized in AP and lateral projections. After timeout procedure and patient identification completed, local anesthetic was infiltrated on the right and left side at T7 with 1% Xylocaine. The back was then prepped and draped in the usual sterile fashion, and a repeat timeout procedure completed. Spinal needle was then used to get local anesthetic down to the lateral aspect of the pedicle on both sides. A small incision was made and a trocar advanced into the vertebral body. Biopsy was attempted, but no bone could be removed, and drilling then carried out. A balloon was inserted and inflated to approximately 2 mL, with partial correction of significant kyphotic deformity. Bone cement was then mixed and inserted, just over 2 mL infiltrated. This seemed to give good interdigitation without extravasation. Trocar was removed. Single-sided stick was all that was required on the right side. Dermabond was used to close the skin and Band-Aid then applied. The patient was sent to the recovery room in stable condition.   ESTIMATED BLOOD LOSS: Minimal.   COMPLICATIONS: None.   SPECIMEN: None.   ____________________________ Leitha SchullerMichael J. Tonetta Napoles, MD mjm:lb D: 01/16/2014 08:24:59 ET T: 01/16/2014 10:48:38 ET JOB#: [id number]  cc: Leitha SchullerMichael J. Leonda Cristo, MD, <Dictator> Leitha SchullerMICHAEL J Elmon Shader MD ELECTRONICALLY SIGNED 01/16/2014 11:47

## 2014-12-28 ENCOUNTER — Ambulatory Visit (INDEPENDENT_AMBULATORY_CARE_PROVIDER_SITE_OTHER): Payer: 59 | Admitting: *Deleted

## 2014-12-28 DIAGNOSIS — E538 Deficiency of other specified B group vitamins: Secondary | ICD-10-CM | POA: Diagnosis not present

## 2014-12-28 MED ORDER — CYANOCOBALAMIN 1000 MCG/ML IJ SOLN
1000.0000 ug | Freq: Once | INTRAMUSCULAR | Status: AC
  Administered 2014-12-28: 1000 ug via INTRAMUSCULAR

## 2015-02-01 ENCOUNTER — Ambulatory Visit (INDEPENDENT_AMBULATORY_CARE_PROVIDER_SITE_OTHER): Payer: 59 | Admitting: *Deleted

## 2015-02-01 DIAGNOSIS — E538 Deficiency of other specified B group vitamins: Secondary | ICD-10-CM

## 2015-02-01 MED ORDER — CYANOCOBALAMIN 1000 MCG/ML IJ SOLN
1000.0000 ug | Freq: Once | INTRAMUSCULAR | Status: AC
  Administered 2015-02-01: 1000 ug via INTRAMUSCULAR

## 2015-02-23 ENCOUNTER — Other Ambulatory Visit: Payer: Self-pay | Admitting: Internal Medicine

## 2015-02-26 ENCOUNTER — Observation Stay: Payer: Medicare Other

## 2015-02-26 ENCOUNTER — Emergency Department: Payer: Medicare Other

## 2015-02-26 ENCOUNTER — Encounter: Payer: Self-pay | Admitting: Emergency Medicine

## 2015-02-26 ENCOUNTER — Observation Stay
Admission: EM | Admit: 2015-02-26 | Discharge: 2015-02-27 | Disposition: A | Discharge status: Home / Self Care | Payer: Medicare Other | Attending: Internal Medicine | Admitting: Internal Medicine

## 2015-02-26 DIAGNOSIS — S065X9A Traumatic subdural hemorrhage with loss of consciousness of unspecified duration, initial encounter: Secondary | ICD-10-CM | POA: Diagnosis present

## 2015-02-26 DIAGNOSIS — J42 Unspecified chronic bronchitis: Secondary | ICD-10-CM | POA: Diagnosis present

## 2015-02-26 DIAGNOSIS — Z836 Family history of other diseases of the respiratory system: Secondary | ICD-10-CM | POA: Diagnosis not present

## 2015-02-26 DIAGNOSIS — Z9660 Presence of unspecified orthopedic joint implant: Secondary | ICD-10-CM | POA: Insufficient documentation

## 2015-02-26 DIAGNOSIS — M1990 Unspecified osteoarthritis, unspecified site: Secondary | ICD-10-CM | POA: Diagnosis present

## 2015-02-26 DIAGNOSIS — X58XXXA Exposure to other specified factors, initial encounter: Secondary | ICD-10-CM | POA: Diagnosis not present

## 2015-02-26 DIAGNOSIS — R0600 Dyspnea, unspecified: Secondary | ICD-10-CM | POA: Insufficient documentation

## 2015-02-26 DIAGNOSIS — R079 Chest pain, unspecified: Secondary | ICD-10-CM | POA: Insufficient documentation

## 2015-02-26 DIAGNOSIS — Z9889 Other specified postprocedural states: Secondary | ICD-10-CM | POA: Diagnosis not present

## 2015-02-26 DIAGNOSIS — R0602 Shortness of breath: Secondary | ICD-10-CM | POA: Insufficient documentation

## 2015-02-26 DIAGNOSIS — N281 Cyst of kidney, acquired: Secondary | ICD-10-CM | POA: Diagnosis not present

## 2015-02-26 DIAGNOSIS — J9811 Atelectasis: Secondary | ICD-10-CM | POA: Insufficient documentation

## 2015-02-26 DIAGNOSIS — Z79899 Other long term (current) drug therapy: Secondary | ICD-10-CM | POA: Insufficient documentation

## 2015-02-26 DIAGNOSIS — M4854XA Collapsed vertebra, not elsewhere classified, thoracic region, initial encounter for fracture: Secondary | ICD-10-CM | POA: Insufficient documentation

## 2015-02-26 DIAGNOSIS — M810 Age-related osteoporosis without current pathological fracture: Secondary | ICD-10-CM | POA: Diagnosis not present

## 2015-02-26 DIAGNOSIS — I4891 Unspecified atrial fibrillation: Secondary | ICD-10-CM | POA: Diagnosis present

## 2015-02-26 DIAGNOSIS — Z87891 Personal history of nicotine dependence: Secondary | ICD-10-CM | POA: Insufficient documentation

## 2015-02-26 DIAGNOSIS — Z7982 Long term (current) use of aspirin: Secondary | ICD-10-CM | POA: Diagnosis not present

## 2015-02-26 DIAGNOSIS — E038 Other specified hypothyroidism: Secondary | ICD-10-CM | POA: Diagnosis present

## 2015-02-26 DIAGNOSIS — I480 Paroxysmal atrial fibrillation: Principal | ICD-10-CM | POA: Insufficient documentation

## 2015-02-26 DIAGNOSIS — E785 Hyperlipidemia, unspecified: Secondary | ICD-10-CM | POA: Diagnosis not present

## 2015-02-26 DIAGNOSIS — R531 Weakness: Secondary | ICD-10-CM | POA: Diagnosis not present

## 2015-02-26 DIAGNOSIS — R61 Generalized hyperhidrosis: Secondary | ICD-10-CM | POA: Diagnosis not present

## 2015-02-26 DIAGNOSIS — E039 Hypothyroidism, unspecified: Secondary | ICD-10-CM | POA: Diagnosis not present

## 2015-02-26 DIAGNOSIS — S065XAA Traumatic subdural hemorrhage with loss of consciousness status unknown, initial encounter: Secondary | ICD-10-CM | POA: Diagnosis present

## 2015-02-26 DIAGNOSIS — I2510 Atherosclerotic heart disease of native coronary artery without angina pectoris: Secondary | ICD-10-CM | POA: Insufficient documentation

## 2015-02-26 DIAGNOSIS — Z791 Long term (current) use of non-steroidal anti-inflammatories (NSAID): Secondary | ICD-10-CM | POA: Diagnosis not present

## 2015-02-26 DIAGNOSIS — I10 Essential (primary) hypertension: Secondary | ICD-10-CM | POA: Diagnosis not present

## 2015-02-26 DIAGNOSIS — S22000A Wedge compression fracture of unspecified thoracic vertebra, initial encounter for closed fracture: Secondary | ICD-10-CM | POA: Diagnosis present

## 2015-02-26 LAB — CBC WITH DIFFERENTIAL/PLATELET
BASOS PCT: 1 %
Basophils Absolute: 0 10*3/uL (ref 0–0.1)
EOS ABS: 0.3 10*3/uL (ref 0–0.7)
Eosinophils Relative: 3 %
HCT: 40.4 % (ref 35.0–47.0)
HEMOGLOBIN: 12.9 g/dL (ref 12.0–16.0)
LYMPHS PCT: 17 %
Lymphs Abs: 1.6 10*3/uL (ref 1.0–3.6)
MCH: 31.7 pg (ref 26.0–34.0)
MCHC: 32 g/dL (ref 32.0–36.0)
MCV: 99.2 fL (ref 80.0–100.0)
MONOS PCT: 8 %
Monocytes Absolute: 0.7 10*3/uL (ref 0.2–0.9)
Neutro Abs: 6.7 10*3/uL — ABNORMAL HIGH (ref 1.4–6.5)
Neutrophils Relative %: 71 %
Platelets: 197 10*3/uL (ref 150–440)
RBC: 4.08 MIL/uL (ref 3.80–5.20)
RDW: 13.5 % (ref 11.5–14.5)
WBC: 9.4 10*3/uL (ref 3.6–11.0)

## 2015-02-26 LAB — TSH: TSH: 2.206 u[IU]/mL (ref 0.350–4.500)

## 2015-02-26 LAB — MAGNESIUM: Magnesium: 1.8 mg/dL (ref 1.7–2.4)

## 2015-02-26 LAB — BASIC METABOLIC PANEL
Anion gap: 9 (ref 5–15)
BUN: 40 mg/dL — ABNORMAL HIGH (ref 6–20)
CALCIUM: 8.8 mg/dL — AB (ref 8.9–10.3)
CHLORIDE: 105 mmol/L (ref 101–111)
CO2: 25 mmol/L (ref 22–32)
Creatinine, Ser: 1.16 mg/dL — ABNORMAL HIGH (ref 0.44–1.00)
GFR calc non Af Amer: 41 mL/min — ABNORMAL LOW (ref >60)
GFR, EST AFRICAN AMERICAN: 48 mL/min — AB (ref >60)
Glucose, Bld: 88 mg/dL (ref 65–99)
POTASSIUM: 4.4 mmol/L (ref 3.5–5.1)
SODIUM: 139 mmol/L (ref 135–145)

## 2015-02-26 LAB — TROPONIN I
TROPONIN I: 0.05 ng/mL — AB (ref <0.031)
Troponin I: <0.03 ng/mL (ref <0.031)
Troponin I: <0.03 ng/mL (ref <0.031)
Troponin I: 0.05 ng/mL — ABNORMAL HIGH (ref <0.031)

## 2015-02-26 LAB — PROTIME-INR
INR: 0.86
Prothrombin Time: 11.9 seconds (ref 11.4–15.0)

## 2015-02-26 LAB — APTT: APTT: 23 s — AB (ref 24–36)

## 2015-02-26 MED ORDER — LEVOTHYROXINE SODIUM 75 MCG PO TABS
75.0000 ug | ORAL_TABLET | Freq: Every day | ORAL | Status: DC
  Administered 2015-02-27: 75 ug via ORAL
  Filled 2015-02-26: qty 1

## 2015-02-26 MED ORDER — DILTIAZEM HCL 25 MG/5ML IV SOLN: 10.0000 mg | Freq: Once | INTRAVENOUS | Status: DC

## 2015-02-26 MED ORDER — LOSARTAN POTASSIUM-HCTZ 50-12.5 MG PO TABS: 1.0000 | ORAL_TABLET | Freq: Every day | ORAL | Status: DC

## 2015-02-26 MED ORDER — SODIUM CHLORIDE 0.9 % IJ SOLN
3.0000 mL | Freq: Two times a day (BID) | INTRAMUSCULAR | Status: DC
  Administered 2015-02-26 (×2): 3 mL via INTRAVENOUS

## 2015-02-26 MED ORDER — MELOXICAM 15 MG PO TABS
15.0000 mg | ORAL_TABLET | Freq: Every day | ORAL | Status: DC
  Administered 2015-02-26 – 2015-02-27 (×2): 15 mg via ORAL
  Filled 2015-02-26 (×3): qty 1

## 2015-02-26 MED ORDER — ASPIRIN EC 81 MG PO TBEC
81.0000 mg | DELAYED_RELEASE_TABLET | Freq: Every day | ORAL | Status: DC
Start: 2015-02-26 — End: 2015-02-27
  Administered 2015-02-26 – 2015-02-27 (×2): 81 mg via ORAL
  Filled 2015-02-26 (×2): qty 1

## 2015-02-26 MED ORDER — PANTOPRAZOLE SODIUM 40 MG PO TBEC
40.0000 mg | DELAYED_RELEASE_TABLET | Freq: Every day | ORAL | Status: DC
  Administered 2015-02-26 – 2015-02-27 (×2): 40 mg via ORAL
  Filled 2015-02-26 (×2): qty 1

## 2015-02-26 MED ORDER — MIRTAZAPINE 15 MG PO TABS
7.5000 mg | ORAL_TABLET | Freq: Every day | ORAL | Status: DC
  Administered 2015-02-26: 7.5 mg via ORAL
  Filled 2015-02-26: qty 1

## 2015-02-26 MED ORDER — ENOXAPARIN SODIUM 40 MG/0.4ML ~~LOC~~ SOLN: 40.0000 mg | SUBCUTANEOUS | Status: DC

## 2015-02-26 MED ORDER — SODIUM CHLORIDE 0.9 % IV SOLN: 250.0000 mL | INTRAVENOUS | Status: DC | PRN

## 2015-02-26 MED ORDER — IOHEXOL 300 MG/ML  SOLN
100.0000 mL | Freq: Once | INTRAMUSCULAR | Status: AC | PRN
  Administered 2015-02-26: 50 mL via INTRAVENOUS

## 2015-02-26 MED ORDER — LOSARTAN POTASSIUM 50 MG PO TABS
50.0000 mg | ORAL_TABLET | Freq: Every day | ORAL | Status: DC
  Administered 2015-02-26 – 2015-02-27 (×2): 50 mg via ORAL
  Filled 2015-02-26 (×2): qty 1

## 2015-02-26 MED ORDER — HYDROCHLOROTHIAZIDE 12.5 MG PO CAPS
12.5000 mg | ORAL_CAPSULE | Freq: Every day | ORAL | Status: DC
  Administered 2015-02-26 – 2015-02-27 (×2): 12.5 mg via ORAL
  Filled 2015-02-26 (×2): qty 1

## 2015-02-26 MED ORDER — ENOXAPARIN SODIUM 30 MG/0.3ML ~~LOC~~ SOLN
30.0000 mg | SUBCUTANEOUS | Status: DC
  Administered 2015-02-26: 30 mg via SUBCUTANEOUS
  Filled 2015-02-26: qty 0.3

## 2015-02-26 MED ORDER — DILTIAZEM HCL 25 MG/5ML IV SOLN
INTRAVENOUS | Status: AC
  Filled 2015-02-26: qty 5

## 2015-02-26 MED ORDER — SIMVASTATIN 40 MG PO TABS
40.0000 mg | ORAL_TABLET | Freq: Every day | ORAL | Status: DC
  Administered 2015-02-26: 40 mg via ORAL
  Filled 2015-02-26 (×2): qty 1

## 2015-02-26 MED ORDER — SODIUM CHLORIDE 0.9 % IJ SOLN: 3.0000 mL | INTRAMUSCULAR | Status: DC | PRN

## 2015-02-26 MED ORDER — SODIUM CHLORIDE 0.9 % IV BOLUS (SEPSIS)
1000.0000 mL | Freq: Once | INTRAVENOUS | Status: AC
  Administered 2015-02-26: 1000 mL via INTRAVENOUS

## 2015-02-26 MED ORDER — ADULT MULTIVITAMIN W/MINERALS CH
1.0000 | ORAL_TABLET | Freq: Every day | ORAL | Status: DC
  Administered 2015-02-26 – 2015-02-27 (×2): 1 via ORAL
  Filled 2015-02-26 (×2): qty 1

## 2015-02-26 MED ORDER — ACETAMINOPHEN 325 MG PO TABS
650.0000 mg | ORAL_TABLET | Freq: Four times a day (QID) | ORAL | Status: DC | PRN
Start: 2015-02-26 — End: 2015-02-27

## 2015-02-26 MED ORDER — ACETAMINOPHEN 650 MG RE SUPP: 650.0000 mg | Freq: Four times a day (QID) | RECTAL | Status: DC | PRN

## 2015-02-26 NOTE — ED Notes (Signed)
Pt readjusted in bed and given warm blankets. Pt states she is more comfortable.

## 2015-02-26 NOTE — ED Notes (Signed)
Troponin collected. NAD Noted at this time. Pt's son at bedside. Denies pain at this time.

## 2015-02-26 NOTE — ED Notes (Signed)
Pt ambulated to the bathroom. Pt became SOB on her way back to bed. Wheezing noted at this time. Pt O2 sat 78% on RA, pt placed on 2L via [place], sats back up to 100%. MD notified.

## 2015-02-26 NOTE — H&P (Signed)
Elizabeth Horne is an 81 y.o. female.   Chief Complaint: Palpitation HPI: 81 YO female with history of palpitation presents was awakened early this morning with palpitations, diaphorisis , SOB and mild chest pain. Brought to the hospital and found to be in atrial fibrilation with rapid ventricular response. Given cardizem and resolved. Still has the sensation of palpitations and shortness of breath. Will admit for further evaluation.   Past Medical History  Diagnosis Date  . Arthritis   . Hypertension   . Hyperlipidemia   . Thyroid disease     Past Surgical History  Procedure Laterality Date  . Tonsillectomy and adenoidectomy    . Total hip arthroplasty Bilateral   . Thyroidectomy, partial      benign  . Total shoulder arthroplasty Bilateral   . Kyphoplasty  2015    x 2, Dr. Rudene Christians  . Abdominal hysterectomy      menorrhagia  . Vaginal delivery      2  . Joint replacement    . Craniotomy      Family History  Problem Relation Age of Onset  . Lung disease Sister    Social History:  reports that she quit smoking about 27 years ago. Her smoking use included Cigarettes. She has never used smokeless tobacco. She reports that she drinks about 0.6 oz of alcohol per week. She reports that she does not use illicit drugs.  Allergies: No Known Allergies   (Not in a hospital admission)  Results for orders placed or performed during the hospital encounter of 02/26/15 (from the past 48 hour(s))  CBC with Differential     Status: Abnormal   Collection Time: 02/26/15  7:39 AM  Result Value Ref Range   WBC 9.4 3.6 - 11.0 K/uL   RBC 4.08 3.80 - 5.20 MIL/uL   Hemoglobin 12.9 12.0 - 16.0 g/dL   HCT 40.4 35.0 - 47.0 %   MCV 99.2 80.0 - 100.0 fL   MCH 31.7 26.0 - 34.0 pg   MCHC 32.0 32.0 - 36.0 g/dL   RDW 13.5 11.5 - 14.5 %   Platelets 197 150 - 440 K/uL   Neutrophils Relative % 71 %   Neutro Abs 6.7 (H) 1.4 - 6.5 K/uL   Lymphocytes Relative 17 %   Lymphs Abs 1.6 1.0 - 3.6 K/uL   Monocytes Relative 8 %   Monocytes Absolute 0.7 0.2 - 0.9 K/uL   Eosinophils Relative 3 %   Eosinophils Absolute 0.3 0 - 0.7 K/uL   Basophils Relative 1 %   Basophils Absolute 0.0 0 - 0.1 K/uL  Basic metabolic panel     Status: Abnormal   Collection Time: 02/26/15  7:39 AM  Result Value Ref Range   Sodium 139 135 - 145 mmol/L   Potassium 4.4 3.5 - 5.1 mmol/L    Comment: HEMOLYSIS AT THIS LEVEL MAY AFFECT RESULT   Chloride 105 101 - 111 mmol/L   CO2 25 22 - 32 mmol/L   Glucose, Bld 88 65 - 99 mg/dL   BUN 40 (H) 6 - 20 mg/dL   Creatinine, Ser 1.16 (H) 0.44 - 1.00 mg/dL   Calcium 8.8 (L) 8.9 - 10.3 mg/dL   GFR calc non Af Amer 41 (L) >60 mL/min   GFR calc Af Amer 48 (L) >60 mL/min    Comment: (NOTE) The eGFR has been calculated using the CKD EPI equation. This calculation has not been validated in all clinical situations. eGFR's persistently <60 mL/min signify possible Chronic  Kidney Disease.    Anion gap 9 5 - 15  Troponin I     Status: None   Collection Time: 02/26/15  7:39 AM  Result Value Ref Range   Troponin I <0.03 <0.031 ng/mL    Comment:        NO INDICATION OF MYOCARDIAL INJURY.   APTT     Status: Abnormal   Collection Time: 02/26/15  7:39 AM  Result Value Ref Range   aPTT 23 (L) 24 - 36 seconds  Protime-INR     Status: None   Collection Time: 02/26/15  7:39 AM  Result Value Ref Range   Prothrombin Time 11.9 11.4 - 15.0 seconds   INR 0.86   TSH     Status: None   Collection Time: 02/26/15  7:39 AM  Result Value Ref Range   TSH 2.206 0.350 - 4.500 uIU/mL  Troponin I     Status: None   Collection Time: 02/26/15  9:11 AM  Result Value Ref Range   Troponin I <0.03 <0.031 ng/mL    Comment:        NO INDICATION OF MYOCARDIAL INJURY.    Dg Chest 1 View  02/26/2015   CLINICAL DATA:  Chest pain beginning this morning with tachycardia.  EXAM: CHEST  1 VIEW  COMPARISON:  03/03/2014 and 10/24/2013  FINDINGS: Lungs are adequately inflated without focal consolidation  or effusion. Borderline cardiomegaly. Calcified plaque over the aortic arch. Evidence of prior multilevel kyphoplasties over the mid to upper thoracic spine. Bilateral shoulder arthroplasties unchanged.  IMPRESSION: No active disease.   Electronically Signed   By: Marin Olp M.D.   On: 02/26/2015 07:44    Review of Systems  Constitutional: Negative for fever and weight loss.  HENT: Negative for hearing loss and sore throat.   Eyes: Negative for blurred vision.  Respiratory: Positive for shortness of breath. Negative for cough and wheezing.   Cardiovascular: Positive for chest pain and palpitations. Negative for leg swelling.  Gastrointestinal: Negative for nausea and vomiting.  Genitourinary: Negative for dysuria.  Musculoskeletal: Positive for back pain.  Skin: Negative for rash.  Neurological: Positive for dizziness and weakness. Negative for tingling, focal weakness and headaches.  Psychiatric/Behavioral: Negative for depression.    Blood pressure 126/71, pulse 84, resp. rate 25, height 5' 1"  (1.549 m), weight 49 kg (108 lb 0.4 oz), SpO2 100 %. Physical Exam  Constitutional: She is oriented to person, place, and time. She appears well-developed and well-nourished.  HENT:  Head: Normocephalic.  Mouth/Throat: Oropharynx is clear and moist.  Eyes: EOM are normal. Pupils are equal, round, and reactive to light. No scleral icterus.  Neck: No JVD present. No tracheal deviation present. No thyromegaly present.  Cardiovascular:  Irregularly irregular. No murmeurs.  Respiratory: No respiratory distress. She has no wheezes. She exhibits no tenderness.  GI: Soft. Bowel sounds are normal. She exhibits no mass.  Musculoskeletal: She exhibits tenderness. She exhibits no edema.  Neurological: She is alert and oriented to person, place, and time. No cranial nerve deficit.  Skin: Skin is warm. No rash noted.     Assessment/Plan 1. Atrial Fibrilation with RVR: Now in NSR. Still has feelings of  palpitations and weakness. Will admit on telemetry. Ambulate on monitor. Consult cardiology. Will hold off on full anticoagulation due to history of intracranial bleed.  2. Hypothyriodism: Check TSH  3. HTN: Continue current meds.  4. Hyperlipedemia: On statins will continue.   Time spent: 45 min  Harrel Lemon  D 02/26/2015, 11:19 AM

## 2015-02-26 NOTE — Progress Notes (Signed)
Anticoagulation monitoring  81 yo female ordered enoxaparin 40 mg SQ q24h for DVT ppx  Ht 61", Wt 49 kg, CrCl 26.3 ml/min Hgb 12.9, Plt 197  Based on CrCl <30 ml/min, will adjust to enoxaparin 30 mg SQ q24h per hospital policy. Pharmacy will continue to monitor.

## 2015-02-26 NOTE — ED Provider Notes (Signed)
[facility] Emergency Department Provider Note  ____________________________________________  Time seen: Seen upon arrival to the emergency department  I have reviewed the triage vital signs and the nursing notes.   HISTORY  Chief Complaint Chest Pain and Palpitations    HPI Elizabeth Horne is a 81 y.o. female with a history of hypertension and thyroid diseasewho presents to the emergency department this morning with chest pain that she woke up with at 5:30 AM. The pain was left-sided and severe. It was nonradiating. It was associated with diaphoresis. The pain subsided after about a half an hour. However, she is having persistent palpitations since. She says that she feels her heart beating rapidly. She has never had any issues like this in the past. The patient has never seen a cardiologist. She was given 4 aspirin during the ambulance ride here.   Past Medical History  Diagnosis Date  . Arthritis   . Hypertension   . Hyperlipidemia   . Thyroid disease     Patient Active Problem List   Diagnosis Date Noted  . Memory loss 08/19/2014  . Adjustment disorder with mixed anxiety and depressed mood 07/07/2014  . HLD (hyperlipidemia) 06/08/2014  . BP (high blood pressure) 06/08/2014  . Hypothyroidism, secondary 06/08/2014  . Osteoporosis, post-menopausal 06/08/2014  . Intracranial subdural hematoma 06/08/2014  . Basilar artery insufficiency 06/08/2014  . Allergic rhinitis 06/08/2014  . Adenopathy 06/08/2014  . Palpitations 06/08/2014  . Cachexia 06/08/2014  . H/O surgical procedure 01/29/2014  . Wedge fracture of thoracic vertebra 01/29/2014  . CN (constipation) 01/29/2014  . Bronchitis, chronic 01/11/2014  . H/O gastrointestinal disease 01/11/2014  . Arthritis, degenerative 01/11/2014    Past Surgical History  Procedure Laterality Date  . Tonsillectomy and adenoidectomy    . Total hip arthroplasty Bilateral   . Thyroidectomy, partial       benign  . Total shoulder arthroplasty Bilateral   . Kyphoplasty  2015    x 2, Dr. Rosita Kea  . Abdominal hysterectomy      menorrhagia  . Vaginal delivery      2    Current Outpatient Rx  Name  Route  Sig  Dispense  Refill  . aspirin 81 MG tablet   Oral   Take 81 mg by mouth daily.         . cetirizine (ZYRTEC) 10 MG tablet   Oral   Take 10 mg by mouth daily.         . cyanocobalamin (,VITAMIN B-12,) 1000 MCG/ML injection      INJECT 1 ML IM EACH MONTH   30 mL   11   . levothyroxine (SYNTHROID, LEVOTHROID) 75 MCG tablet   Oral   Take 75 mcg by mouth daily before breakfast.         . losartan-hydrochlorothiazide (HYZAAR) 50-12.5 MG per tablet      TAKE ONE TABLET BY MOUTH EVERY DAY   90 tablet   1   . meloxicam (MOBIC) 15 MG tablet   Oral   Take 15 mg by mouth daily.         . mirtazapine (REMERON) 7.5 MG tablet      TAKE ONE TABLET BY MOUTH AT BEDTIME   30 tablet   2     FOR FUTURE FILL THANKS   . Multiple Vitamin (MULTIVITAMIN) tablet   Oral   Take 1 tablet by mouth daily.         Marland Kitchen omeprazole (PRILOSEC) 20 MG capsule  TAKE 1 CAPSULE EVERY DAY   30 capsule   11   . simvastatin (ZOCOR) 40 MG tablet   Oral   Take 40 mg by mouth daily.           Allergies Review of patient's allergies indicates no known allergies.  Family History  Problem Relation Age of Onset  . Lung disease Sister     Social History History  Substance Use Topics  . Smoking status: Former Smoker    Types: Cigarettes    Quit date: 06/09/1987  . Smokeless tobacco: Never Used  . Alcohol Use: Yes     Comment: daily glass of wine or 2    Review of Systems Constitutional: No fever/chills Eyes: No visual changes. ENT: No sore throat. Cardiovascular: As above  Respiratory: Denies shortness of breath. Gastrointestinal: No abdominal pain.  No nausea, no vomiting.  No diarrhea.  No constipation. Genitourinary: Negative for dysuria. Musculoskeletal: Negative for  back pain. Skin: Negative for rash. Neurological: Negative for headaches, focal weakness or numbness.  10-point ROS otherwise negative.  ____________________________________________   PHYSICAL EXAM:  VITAL SIGNS: ED Triage Vitals  Enc Vitals Group     BP 02/26/15 0727 98/63 mmHg     Pulse Rate 02/26/15 0727 146     Resp 02/26/15 0727 22     Temp --      Temp src --      SpO2 --      Weight 02/26/15 0727 108 lb 0.4 oz (49 kg)     Height 02/26/15 0727 5\' 1"  (1.549 m)     Head Cir --      Peak Flow --      Pain Score 02/26/15 0729 2     Pain Loc --      Pain Edu? --      Excl. in GC? --     Constitutional: Alert and oriented. Well appearing and in no acute distress. Eyes: Conjunctivae are normal. PERRL. EOMI. Head: Atraumatic. Nose: No congestion/rhinnorhea. Mouth/Throat: Mucous membranes are moist.  Oropharynx non-erythematous. Neck: No stridor.   Cardiovascular: Irregularly irregular rhythm. Grossly normal heart sounds.  Good peripheral circulation. Respiratory: Normal respiratory effort.  No retractions. Lungs CTAB. Gastrointestinal: Soft and nontender. No distention. No abdominal bruits. No CVA tenderness. Musculoskeletal: No lower extremity tenderness nor edema.  No joint effusions. Neurologic:  Normal speech and language. No gross focal neurologic deficits are appreciated. Speech is normal. No gait instability. Skin:  Skin is warm, dry and intact. No rash noted. Psychiatric: Mood and affect are normal. Speech and behavior are normal.  ____________________________________________   LABS (all labs ordered are listed, but only abnormal results are displayed)  Labs Reviewed  CBC WITH DIFFERENTIAL/PLATELET - Abnormal; Notable for the following:    Neutro Abs 6.7 (*)    All other components within normal limits  BASIC METABOLIC PANEL - Abnormal; Notable for the following:    BUN 40 (*)    Creatinine, Ser 1.16 (*)    Calcium 8.8 (*)    GFR calc non Af Amer 41 (*)     GFR calc Af Amer 48 (*)    All other components within normal limits  APTT - Abnormal; Notable for the following:    aPTT 23 (*)    All other components within normal limits  TROPONIN I  PROTIME-INR  TSH  TROPONIN I   ____________________________________________  EKG  ED ECG REPORT I, Arelia Longest, the attending physician, personally viewed and interpreted  this ECG.   Date: 02/26/2015  EKG Time: 721  Rate: 139  Rhythm: Atrial fibrillation with rapid ventricular response.  Axis: Left axis deviation  Intervals:none  ST&T Change: ST depressions of 0.5-1 mm in V4 through V6 which is likely demand related  ED ECG REPORT I, Xiamara Hulet,  Teena Irani, the attending physician, personally viewed and interpreted this ECG.   Date: 02/26/2015  EKG Time: 9  Rate: 88  Rhythm: normal sinus rhythm  Axis: Normal axis  Intervals:none  ST&T Change: No ST elevations or depressions. No abnormal T-wave inversions. Mueller parents to to past EKGs on record.  ____________________________________________  RADIOLOGY  Chest x-ray NAD. I personally reviewed the images. ____________________________________________  CRITICAL CARE Performed by: Arelia Longest   Total critical care time: 35 minutes  Critical care time was exclusive of separately billable procedures and treating other patients.  Critical care was necessary to treat or prevent imminent or life-threatening deterioration.  Critical care was time spent personally by me on the following activities: development of treatment plan with patient and/or surrogate as well as nursing, discussions with consultants, evaluation of patient's response to treatment, examination of patient, obtaining history from patient or surrogate, ordering and performing treatments and interventions, ordering and review of laboratory studies, ordering and review of radiographic studies, pulse oximetry and re-evaluation of patient's  condition.  Required push dose Cardizem which converted the patient to normal sinus rhythm.  ____________________________________________   INITIAL IMPRESSION / ASSESSMENT AND PLAN / ED COURSE  Pertinent labs & imaging results that were available during my care of the patient were reviewed by me and considered in my medical decision making (see chart for details).  ----------------------------------------- 9:18 AM on 02/26/2015 -----------------------------------------  Patient now back in normal sinus rhythm. Discussed with Dr. Darrold Junker who says patient may be discharged on 25 mg twice a day metoprolol. Should follow-up in the office.  ----------------------------------------- 10:37 AM on 02/26/2015 -----------------------------------------  Patient resting comfortably this time. However, desatted when walking to bathroom with shortness of breath. Will order CAT scan of the chest for PE rule out. To be admitted to the hospital. Signed out to Dr. Letitia Libra. ____________________________________________   FINAL CLINICAL IMPRESSION(S) / ED DIAGNOSES  Acute chest pain with shortness of breath. Acute A. fib with RVR, resolved. Initial visit.    Myrna Blazer, MD 02/26/15 [id number]

## 2015-02-26 NOTE — ED Notes (Signed)
Pt at CT at this time.

## 2015-02-26 NOTE — Progress Notes (Signed)
Paged Dr. Letitia Libra regarding elevated troponin of 0.05, MD acknowledged. No new orders at this time.

## 2015-02-26 NOTE — ED Notes (Signed)
BIB EMS from home reports of CP that started about 0530 that woke her up out of her sleep. Pt reports feeling like her heart is racing. HR reported to be 130-140. Given Aspirin 324mg  by EMS.

## 2015-02-26 NOTE — ED Notes (Signed)
Report given to Lauryn, RN.  

## 2015-02-26 NOTE — Progress Notes (Addendum)
Pt. admitted to unit, report taken from ED RN, Junie Panning. Oriented to room, call bell, Ascom phones and staff. Bed in low position, yellow non-skid socks in place, bed alarm on. Pt. Educated on TEDs, however is refusing at this time. NSR on TELE, 80s. Skin assessed with Bubba Camp, RN. Fall safety plan reviewed, fall contract signed by pt and RN and placed on wall. Full assessment to Epic. Will continue to monitor.

## 2015-02-26 NOTE — ED Notes (Signed)
Pt returned from ct at this time

## 2015-02-26 NOTE — Consult Note (Signed)
[facility]  CARDIOLOGY CONSULT NOTE  Patient ID: Elizabeth Horne MRN: [id number] DOB/AGE: February 18, 1926 81 y.o.  Admit date: 02/26/2015 Referring Physician Shawn Stall M.D. Primary Physician Kandyce Rud MD Primary Cardiologist Arnoldo Hooker M.D. Reason for Consultation atrial fibrillation  HPI: The patient is an 81 year old female referred for evaluation of paroxysmal atrial fibrillation. The patient reports a one to two-year history of intermittent episodes of palpitations. She apparently has been experiencing increasing palpitations for the past several weeks. Previous workup including monitor did not reveal evidence for arrhythmia. The patient awoke early this a.m. with palpitations and chest discomfort and presented to [facility] emergency room. ECG revealed atrial fibrillation with a rapid ventricular rate at 130 bpm. The patient was treated with intravenous bolus and converted to sinus rhythm. Admission labs were notable for normal troponin.  The patient has a history of subdural hematoma acquiring surgical evacuation 3/13 at [facility]. According to the patient and her son, patient apparently tripped and fell and banged her head on the kitchen counter, with chronic symptoms for months to diagnosis of subdural hematoma and surgical repair.  We discussed in detail, the risks, benefits and alternatives of chronic anticoagulation, as well as, the advantages and disadvantages warfarin versus the novel anticoagulants. Patient has a chads Vasc of 4, and would be a candidate for chronic anticoagulation, despite the history of of subdural hematoma which was not spontaneous, and due to a fall. The patient has had no recurrent falls, and no other history of bleeding.  Review of systems complete and found to be negative unless listed above     Past Medical History  Diagnosis Date  . Arthritis   . Hypertension   . Hyperlipidemia   . Thyroid disease     Past Surgical History  Procedure  Laterality Date  . Tonsillectomy and adenoidectomy    . Total hip arthroplasty Bilateral   . Thyroidectomy, partial      benign  . Total shoulder arthroplasty Bilateral   . Kyphoplasty  2015    x 2, Dr. Rosita Kea  . Abdominal hysterectomy      menorrhagia  . Vaginal delivery      2  . Joint replacement    . Craniotomy      Prescriptions prior to admission  Medication Sig Dispense Refill Last Dose  . aspirin 81 MG tablet Take 81 mg by mouth daily.   02/25/2015 at Unknown time  . cetirizine (ZYRTEC) 10 MG tablet Take 10 mg by mouth daily as needed for allergies or rhinitis.    02/25/2015 at Unknown time  . cyanocobalamin (,VITAMIN B-12,) 1000 MCG/ML injection INJECT 1 ML IM EACH MONTH 30 mL 11 Past Month at Unknown time  . levothyroxine (SYNTHROID, LEVOTHROID) 75 MCG tablet Take 75 mcg by mouth daily before breakfast.   02/25/2015 at Unknown time  . losartan-hydrochlorothiazide (HYZAAR) 50-12.5 MG per tablet TAKE ONE TABLET BY MOUTH EVERY DAY 90 tablet 1 02/25/2015 at Unknown time  . meloxicam (MOBIC) 15 MG tablet Take 15 mg by mouth daily.   02/25/2015 at Unknown time  . mirtazapine (REMERON) 7.5 MG tablet TAKE ONE TABLET BY MOUTH AT BEDTIME 30 tablet 2 02/25/2015 at Unknown time  . omeprazole (PRILOSEC) 20 MG capsule TAKE 1 CAPSULE EVERY DAY 30 capsule 11 02/25/2015 at Unknown time  . simvastatin (ZOCOR) 40 MG tablet Take 40 mg by mouth daily.   02/25/2015 at Unknown time   History   Social History  . Marital Status: Widowed  Spouse Name: N/A  . Number of Children: N/A  . Years of Education: N/A   Occupational History  . Not on file.   Social History Main Topics  . Smoking status: Former Smoker    Types: Cigarettes    Quit date: 06/09/1987  . Smokeless tobacco: Never Used  . Alcohol Use: 0.6 oz/week    1 Glasses of wine per week     Comment: daily glass of wine or 2  . Drug Use: No  . Sexual Activity: Not on file   Other Topics Concern  . Not on file   Social History  Narrative   Lives in [place]. Has 10hr assistance during the day.      Two children.          Family History  Problem Relation Age of Onset  . Lung disease Sister       Review of systems complete and found to be negative unless listed above      PHYSICAL EXAM  General: Well developed, well nourished, in no acute distress HEENT:  Normocephalic and atramatic Neck:  No JVD.  Lungs: Clear bilaterally to auscultation and percussion. Heart: HRRR . Normal S1 and S2 without gallops or murmurs.  Abdomen: Bowel sounds are positive, abdomen soft and non-tender  Msk:  Back normal, normal gait. Normal strength and tone for age. Extremities: No clubbing, cyanosis or edema.   Neuro: Alert and oriented X 3. Psych:  Good affect, responds appropriately  Labs:   Lab Results  Component Value Date   WBC 9.4 02/26/2015   HGB 12.9 02/26/2015   HCT 40.4 02/26/2015   MCV 99.2 02/26/2015   PLT 197 02/26/2015    Recent Labs Lab 02/26/15 0739  NA 139  K 4.4  CL 105  CO2 25  BUN 40*  CREATININE 1.16*  CALCIUM 8.8*  GLUCOSE 88   Lab Results  Component Value Date   TROPONINI <0.03 02/26/2015    Lab Results  Component Value Date   CHOL 155 06/08/2014   Lab Results  Component Value Date   HDL 76.70 06/08/2014   Lab Results  Component Value Date   LDLCALC 69 06/08/2014   Lab Results  Component Value Date   TRIG 49.0 06/08/2014   Lab Results  Component Value Date   CHOLHDL 2 06/08/2014   No results found for: LDLDIRECT    Radiology: Dg Chest 1 View  02/26/2015   CLINICAL DATA:  Chest pain beginning this morning with tachycardia.  EXAM: CHEST  1 VIEW  COMPARISON:  03/03/2014 and 10/24/2013  FINDINGS: Lungs are adequately inflated without focal consolidation or effusion. Borderline cardiomegaly. Calcified plaque over the aortic arch. Evidence of prior multilevel kyphoplasties over the mid to upper thoracic spine. Bilateral shoulder arthroplasties unchanged.  IMPRESSION:  No active disease.   Electronically Signed   By: Elberta Fortis M.D.   On: 02/26/2015 07:44   Ct Angio Chest Pe W/cm &/or Wo Cm  02/26/2015   CLINICAL DATA:  Palpitations, diaphoresis and shortness of breath with mid chest pain beginning this morning. Patient in AFib which has resolved. Persistent shortness-of-breath.  EXAM: CT ANGIOGRAPHY CHEST WITH CONTRAST  TECHNIQUE: Multidetector CT imaging of the chest was performed using the standard protocol during bolus administration of intravenous contrast. Multiplanar CT image reconstructions and MIPs were obtained to evaluate the vascular anatomy.  CONTRAST:  71mL OMNIPAQUE IOHEXOL 300 MG/ML  SOLN  COMPARISON:  12/26/2010 and chest x-ray 02/26/2015 as well as MRI thoracic spine 02/13/2014  and plain films 01/25/2014  FINDINGS: Bilateral shoulder prostheses cause moderate streak artifact. Lungs are adequately inflated with mild emphysematous disease. Small calcified granuloma over the right upper lobe and lingula very small amount of bilateral pleural fluid left worse than right. Linear scarring over the posterior right lower lobe with new adjacent focal nodular airspace opacification. Minimal dependent atelectasis over the posterior right upper lobe. Small nodular opacity abutting a vessel over the left upper lobe difficult to accurately measure but approximately 5 mm. Airways are within normal.  Heart is normal in size. Mild calcification over the mitral valve annulus. Minimal calcified plaque over the left into descending and lateral circumflex coronary arteries. No evidence of pulmonary embolism. 1 cm AP window and precarinal lymph nodes likely reactive. No significant hilar or axillary adenopathy. Mild calcified plaque over the thoracic aorta.  Images through the upper abdomen demonstrate calcified plaque over the abdominal aorta. There are multiple bilateral renal cysts. There are 4 adjacent compression fractures post kyphoplasty over the mid thoracic spine. There  is a severe compression fracture immediately below the most inferior kyphoplasty likely representing T9. Old inferior sternal fracture.  Review of the MIP images confirms the above findings.  IMPRESSION: No evidence of pulmonary embolism.  Mild emphysematous disease with very small bilateral pleural effusions left greater than right. Mild dependent atelectasis over the posterior right upper lobe. Scarring over the posterior right lower lobe with focal nodular airspace opacification over the posterior right lower lobe which may be due to atelectasis or infection. Cannot completely exclude low-grade malignancy. 5 mm nodular opacity over the left upper lobe slightly more prominent. Recommend followup CT 4 weeks.  1 cm AP window and precarinal lymph nodes likely reactive.  Minimal atherosclerotic coronary artery disease.  Bilateral renal cysts.  Four adjacent thoracic spine compression fractures post kyphoplasty. Moderate compression fracture of T9 immediately below the most inferior kyphoplasty new since June 2015.   Electronically Signed   By: Elberta Fortis M.D.   On: 02/26/2015 12:04    EKG: Initial atrial fibrillation with a rapid ventricular rate, converted to sinus rhythm.  ASSESSMENT AND PLAN:   81 year old female with paroxysmal atrial fibrillation, chads score 4, currently in sinus rhythm. Patient does have a history of prior subdural hematoma, secondary to an accident without recurrence. We discussed in detail, the risks, benefits and alternatives of chronic anticoagulation, as well as, the advantages and disadvantages of warfarin versus a novel anticoagulants.  Recommendations  1. Agree with overall current therapy 2. Consider 2-D echocardiogram if not done within the past 6 months 3. Cardizem CD 120 mg daily 4. Consider apixaban 2.5 mg twice a day. I instructed patient and her son to discuss with each the merits of starting apixaban to prevent stroke vs risk of recurrent intracranial bleed, or  GI bleeding.   SignedMarcina Millard MD,PhD, [facility] 02/26/2015, 1:39 PM

## 2015-02-27 LAB — TROPONIN I: Troponin I: 0.05 ng/mL — ABNORMAL HIGH (ref <0.031)

## 2015-02-27 NOTE — Discharge Summary (Signed)
[facility] - [place] [facility]   PATIENT NAME: Elizabeth Horne    MR#:  [id number]  DATE OF BIRTH:  12/11/1925  DATE OF ADMISSION:  02/26/2015 ADMITTING PHYSICIAN: Gracelyn Nurse, MD  DATE OF DISCHARGE: 02/27/2015   PRIMARY CARE PHYSICIAN: Wynona Dove, MD    ADMISSION DIAGNOSIS:  Dyspnea [R06.00] Paroxysmal a-fib [I48.0] Chest pain, unspecified chest pain type [R07.9]  DISCHARGE DIAGNOSIS:  Principal Problem:   Atrial fibrillation with RVR Active Problems:   Bronchitis, chronic   HLD (hyperlipidemia)   BP (high blood pressure)   Hypothyroidism, secondary   Osteoporosis, post-menopausal   H/O surgical procedure   Intracranial subdural hematoma   Wedge fracture of thoracic vertebra   Arthritis, degenerative   Palpitations   SECONDARY DIAGNOSIS:   Past Medical History  Diagnosis Date  . Arthritis   . Hypertension   . Hyperlipidemia   . Thyroid disease     HOSPITAL COURSE:  This is an 81 year old female with history of hypothyroidism, hypertension and subdural hematoma who presented with palpitations on of atrial fibrillation with RVR. For further details please refer the H&P.  1. Atrial fibrillation with RVR: Patient converted to normal sinus rhythm while in the emergency room. Patient had no atrial fibrillation while on telemetry. Cardiology has seen the patient in consultation. They are in discussions with the patient for anticoagulation due to her chads score of 4.  2. Essential hypertension: Patient will continue losartan/HCTZ.  3. Hypothyroidism: Patient will continue her Synthroid.4. Hyperlipidemia: Patient will continue statin.   DISCHARGE CONDITIONS AND DIET:  Patient is to be discharged home in stable condition on a heart healthy diet  CONSULTS OBTAINED:  Treatment Team:  Marcina Millard, MD  DRUG ALLERGIES:  No Known Allergies  DISCHARGE MEDICATIONS:   Current Discharge Medication List    CONTINUE these  medications which have NOT CHANGED   Details  aspirin 81 MG tablet Take 81 mg by mouth daily.    cetirizine (ZYRTEC) 10 MG tablet Take 10 mg by mouth daily as needed for allergies or rhinitis.     cyanocobalamin (,VITAMIN B-12,) 1000 MCG/ML injection INJECT 1 ML IM EACH MONTH Qty: 30 mL, Refills: 11    levothyroxine (SYNTHROID, LEVOTHROID) 75 MCG tablet Take 75 mcg by mouth daily before breakfast.    losartan-hydrochlorothiazide (HYZAAR) 50-12.5 MG per tablet TAKE ONE TABLET BY MOUTH EVERY DAY Qty: 90 tablet, Refills: 1    meloxicam (MOBIC) 15 MG tablet Take 15 mg by mouth daily.    mirtazapine (REMERON) 7.5 MG tablet TAKE ONE TABLET BY MOUTH AT BEDTIME Qty: 30 tablet, Refills: 2    omeprazole (PRILOSEC) 20 MG capsule TAKE 1 CAPSULE EVERY DAY Qty: 30 capsule, Refills: 11    simvastatin (ZOCOR) 40 MG tablet Take 40 mg by mouth daily.      STOP taking these medications     Multiple Vitamin (MULTIVITAMIN) tablet               Today   CHIEF COMPLAINT:  Patient has no symptoms or complaints morning. Patient is in normal sinus rhythm. No acute events on telemetry.   VITAL SIGNS:  Blood pressure 118/53, pulse 86, temperature 97.8 F (36.6 C), temperature source Oral, resp. rate 16, height 5' (1.524 m), weight 46.902 kg (103 lb 6.4 oz), SpO2 99 %.   REVIEW OF SYSTEMS:  Review of Systems  Constitutional: Negative for fever, chills and malaise/fatigue.  HENT: Negative for sore throat.   Eyes: Negative for blurred  vision.  Respiratory: Negative for cough, hemoptysis, shortness of breath and wheezing.   Cardiovascular: Negative for chest pain, palpitations and leg swelling.  Gastrointestinal: Negative for nausea, vomiting, abdominal pain, diarrhea and blood in stool.  Genitourinary: Negative for dysuria.  Musculoskeletal: Negative for back pain.  Neurological: Negative for dizziness, tremors and headaches.  Endo/Heme/Allergies: Does not bruise/bleed easily.   Psychiatric/Behavioral: Negative for depression.     PHYSICAL EXAMINATION:  GENERAL:  81 y.o.-year-old patient lying in the bed with no acute distress.  NECK:  Supple, no jugular venous distention. No thyroid enlargement, no tenderness.  LUNGS: Normal breath sounds bilaterally, no wheezing, rales,rhonchi  No use of accessory muscles of respiration.  CARDIOVASCULAR: S1, S2 normal. No murmurs, rubs, or gallops.  ABDOMEN: Soft, non-tender, non-distended. Bowel sounds present. No organomegaly or mass.  EXTREMITIES: No pedal edema, cyanosis, or clubbing.  PSYCHIATRIC: The patient is alert and oriented x 3.  SKIN: No obvious rash, lesion, or ulcer.   DATA REVIEW:   CBC  Recent Labs Lab 02/26/15 0739  WBC 9.4  HGB 12.9  HCT 40.4  PLT 197    Chemistries   Recent Labs Lab 02/26/15 0739 02/26/15 1259  NA 139  --   K 4.4  --   CL 105  --   CO2 25  --   GLUCOSE 88  --   BUN 40*  --   CREATININE 1.16*  --   CALCIUM 8.8*  --   MG  --  1.8    Cardiac Enzymes  Recent Labs Lab 02/26/15 1259 02/26/15 1956 02/27/15 0050  TROPONINI 0.05* 0.05* 0.05*    Microbiology Results  @  RADIOLOGY:  Dg Chest 1 View  02/26/2015   CLINICAL DATA:  Chest pain beginning this morning with tachycardia.  EXAM: CHEST  1 VIEW  COMPARISON:  03/03/2014 and 10/24/2013  FINDINGS: Lungs are adequately inflated without focal consolidation or effusion. Borderline cardiomegaly. Calcified plaque over the aortic arch. Evidence of prior multilevel kyphoplasties over the mid to upper thoracic spine. Bilateral shoulder arthroplasties unchanged.  IMPRESSION: No active disease.   Electronically Signed   By: Elberta Fortis M.D.   On: 02/26/2015 07:44   Ct Angio Chest Pe W/cm &/or Wo Cm  02/26/2015   CLINICAL DATA:  Palpitations, diaphoresis and shortness of breath with mid chest pain beginning this morning. Patient in AFib which has resolved. Persistent shortness-of-breath.  EXAM: CT ANGIOGRAPHY  CHEST WITH CONTRAST  TECHNIQUE: Multidetector CT imaging of the chest was performed using the standard protocol during bolus administration of intravenous contrast. Multiplanar CT image reconstructions and MIPs were obtained to evaluate the vascular anatomy.  CONTRAST:  50mL OMNIPAQUE IOHEXOL 300 MG/ML  SOLN  COMPARISON:  12/26/2010 and chest x-ray 02/26/2015 as well as MRI thoracic spine 02/13/2014 and plain films 01/25/2014  FINDINGS: Bilateral shoulder prostheses cause moderate streak artifact. Lungs are adequately inflated with mild emphysematous disease. Small calcified granuloma over the right upper lobe and lingula very small amount of bilateral pleural fluid left worse than right. Linear scarring over the posterior right lower lobe with new adjacent focal nodular airspace opacification. Minimal dependent atelectasis over the posterior right upper lobe. Small nodular opacity abutting a vessel over the left upper lobe difficult to accurately measure but approximately 5 mm. Airways are within normal.  Heart is normal in size. Mild calcification over the mitral valve annulus. Minimal calcified plaque over the left into descending and lateral circumflex coronary arteries. No evidence of pulmonary embolism. 1 cm AP  window and precarinal lymph nodes likely reactive. No significant hilar or axillary adenopathy. Mild calcified plaque over the thoracic aorta.  Images through the upper abdomen demonstrate calcified plaque over the abdominal aorta. There are multiple bilateral renal cysts. There are 4 adjacent compression fractures post kyphoplasty over the mid thoracic spine. There is a severe compression fracture immediately below the most inferior kyphoplasty likely representing T9. Old inferior sternal fracture.  Review of the MIP images confirms the above findings.  IMPRESSION: No evidence of pulmonary embolism.  Mild emphysematous disease with very small bilateral pleural effusions left greater than right. Mild  dependent atelectasis over the posterior right upper lobe. Scarring over the posterior right lower lobe with focal nodular airspace opacification over the posterior right lower lobe which may be due to atelectasis or infection. Cannot completely exclude low-grade malignancy. 5 mm nodular opacity over the left upper lobe slightly more prominent. Recommend followup CT 4 weeks.  1 cm AP window and precarinal lymph nodes likely reactive.  Minimal atherosclerotic coronary artery disease.  Bilateral renal cysts.  Four adjacent thoracic spine compression fractures post kyphoplasty. Moderate compression fracture of T9 immediately below the most inferior kyphoplasty new since June 2015.   Electronically Signed   By: Elberta Fortis M.D.   On: 02/26/2015 12:04      Management plans discussed with the patient and she is in agreement. Stable for discharge home   Patient should follow up with PCP and Dr Evette Georges in 1 week  CODE STATUS:     Code Status Orders        Start     Ordered   02/26/15 1220  Full code   Continuous     02/26/15 1219    Advance Directive Documentation        Most Recent Value   Type of Advance Directive  Healthcare Power of Attorney, Living will   Pre-existing out of facility DNR order (yellow form or pink MOST form)     "MOST" Form in Place?        TOTAL TIME TAKING CARE OF THIS PATIENT: 35 minutes.    Lenice Koper M.D on 02/27/2015 at 9:16 AM  Between 7am to 6pm - Pager - [phone] After 6pm go to [email or web address] - password [email or web address] [facility]  [place] [facility]  Office  [phone]  CC: Primary care physician; Wynona Dove, MD

## 2015-02-27 NOTE — Progress Notes (Signed)
Discharge instructions given along with afib education and anticoagulant teaching. Per cardiology patient will go home on aspirin and will follow up outpatient to consider other anticoagulant. Patient and son are aware and will make follow up appointments first thing tomorrow. IV's and tele discontinued. Patient had been on 2L of oxygen while here, but saturation on room air is at 93% with exertion, no need for home oxygen. Patient has no further questions.

## 2015-02-28 ENCOUNTER — Telehealth: Payer: Self-pay

## 2015-02-28 NOTE — Telephone Encounter (Addendum)
Discharge Date: 02/27/15  Transition Care Management Follow-up Telephone Call  How have you been since you were released from the hospital? Pt states she is doing ok, denies any CP or dyspnea since discharge.  Do you understand why you were in the hospital? YES, Dyspnea, a-fib, chest pain   Do you understand the discharge instructions? YES, pt had no questions regarding instructions  Items Reviewed:  Medications reviewed: YES, no medication changes  Allergies reviewed: YES, no new drug allergies  Dietary changes reviewed: YES, heart healthy diet  Referrals reviewed: n/a   Functional Questionnaire:   Activities of Daily Living (ADLs):  She states they are independent in the following: Has caretaker that assists as needed States they require assistance with the following:   Any transportation issues/concerns?: NO, family member or caretaker will drive her to her appt.    Any patient concerns? No concerns at this time. Advised to call back with any questions, concerns, or problems before her visit,  verbalized understanding  Confirmed importance and date/time of follow-up visits scheduled: YES. Follow up with Dr. Celene KrasParschos in one week. Has appt scheduled 03/08/15. Pt confirmed  Appt with Lyla Sonarrie 03/03/15 @ 2:00  Confirmed with patient if condition begins to worsen call PCP or go to the ER. Patient was given the Call-a-Nurse line [phone]: YES

## 2015-02-28 NOTE — Telephone Encounter (Signed)
The patient called to schedule her hospital follow up.  She states she was discharged from the hospital this weekend.

## 2015-03-03 ENCOUNTER — Ambulatory Visit (INDEPENDENT_AMBULATORY_CARE_PROVIDER_SITE_OTHER): Payer: Medicare Other | Admitting: Nurse Practitioner

## 2015-03-03 VITALS — BP 110/68 | HR 96 | Temp 98.1°F | Resp 14 | Ht 60.0 in | Wt 103.0 lb

## 2015-03-03 DIAGNOSIS — M7122 Synovial cyst of popliteal space [Baker], left knee: Secondary | ICD-10-CM | POA: Diagnosis not present

## 2015-03-03 DIAGNOSIS — I4891 Unspecified atrial fibrillation: Secondary | ICD-10-CM | POA: Diagnosis not present

## 2015-03-03 DIAGNOSIS — Z09 Encounter for follow-up examination after completed treatment for conditions other than malignant neoplasm: Secondary | ICD-10-CM

## 2015-03-03 NOTE — Progress Notes (Signed)
Pre visit review using our clinic review tool, if applicable. No additional management support is needed unless otherwise documented below in the visit note. 

## 2015-03-03 NOTE — Progress Notes (Addendum)
Subjective:    Patient ID: Elizabeth Horne, female    DOB: 1925/12/04, 81 y.o.   MRN: [id number]  HPI  [name] is a 81 yo female with a CC of hospital follow up Daughter in law is with her today.   1) [facility] d/c 02/26/15- dx: dyspnea, paroxysmal a-fib, chest pain  Feeling improved Heart Healthy Diet- following Dr. Darrold JunkerParaschos follow up in 1 week   2) Left posterior knee- non-painful large mass, 6-12 months no loss of ROM  Review of Systems  Constitutional: Negative for fever, chills, diaphoresis and fatigue.  Respiratory: Negative for chest tightness, shortness of breath and wheezing.   Cardiovascular: Negative for chest pain, palpitations and leg swelling.  Gastrointestinal: Negative for nausea, vomiting and diarrhea.  Musculoskeletal: Negative for joint swelling.  Skin: Negative for rash.       Mass behind left knee  Neurological: Negative for dizziness, weakness, numbness and headaches.  Psychiatric/Behavioral: The patient is not nervous/anxious.    Past Medical History  Diagnosis Date  . Arthritis   . Hypertension   . Hyperlipidemia   . Thyroid disease     History   Social History  . Marital Status: Widowed    Spouse Name: N/A  . Number of Children: N/A  . Years of Education: N/A   Occupational History  . Not on file.   Social History Main Topics  . Smoking status: Former Smoker    Types: Cigarettes    Quit date: 06/09/1987  . Smokeless tobacco: Never Used  . Alcohol Use: 0.6 oz/week    1 Glasses of wine per week     Comment: daily glass of wine or 2  . Drug Use: No  . Sexual Activity: Not on file   Other Topics Concern  . Not on file   Social History Narrative   Lives in [place]. Has 10hr assistance during the day.      Two children.          Past Surgical History  Procedure Laterality Date  . Tonsillectomy and adenoidectomy    . Total hip arthroplasty Bilateral   . Thyroidectomy, partial      benign  . Total shoulder arthroplasty  Bilateral   . Kyphoplasty  2015    x 2, Dr. Rosita KeaMenz  . Abdominal hysterectomy      menorrhagia  . Vaginal delivery      2  . Joint replacement    . Craniotomy      Family History  Problem Relation Age of Onset  . Lung disease Sister     No Known Allergies  Current Outpatient Prescriptions on File Prior to Visit  Medication Sig Dispense Refill  . aspirin 81 MG tablet Take 81 mg by mouth daily.    . cetirizine (ZYRTEC) 10 MG tablet Take 10 mg by mouth daily as needed for allergies or rhinitis.     . cyanocobalamin (,VITAMIN B-12,) 1000 MCG/ML injection INJECT 1 ML IM EACH MONTH 30 mL 11  . levothyroxine (SYNTHROID, LEVOTHROID) 75 MCG tablet Take 75 mcg by mouth daily before breakfast.    . losartan-hydrochlorothiazide (HYZAAR) 50-12.5 MG per tablet TAKE ONE TABLET BY MOUTH EVERY DAY 90 tablet 1  . meloxicam (MOBIC) 15 MG tablet Take 15 mg by mouth daily.    . mirtazapine (REMERON) 7.5 MG tablet TAKE ONE TABLET BY MOUTH AT BEDTIME 30 tablet 2  . omeprazole (PRILOSEC) 20 MG capsule TAKE 1 CAPSULE EVERY DAY 30 capsule 11  . simvastatin (  ZOCOR) 40 MG tablet Take 40 mg by mouth daily.     No current facility-administered medications on file prior to visit.        Objective:   Physical Exam  Constitutional: She is oriented to person, place, and time. She appears well-developed and well-nourished. No distress.  BP 110/68 mmHg  Pulse 96  Temp(Src) 98.1 F (36.7 C)  Resp 14  Ht 5' (1.524 m)  Wt 103 lb (46.72 kg)  BMI 20.12 kg/m2  SpO2 94%   HENT:  Head: Normocephalic and atraumatic.  Right Ear: External ear normal.  Left Ear: External ear normal.  Cardiovascular: Normal rate, regular rhythm and normal heart sounds.   Pulmonary/Chest: Effort normal and breath sounds normal. No respiratory distress. She has no wheezes. She has no rales. She exhibits no tenderness.  Musculoskeletal:  Non-tender mass behind left knee, 2-3 inches   Neurological: She is alert and oriented to  person, place, and time. No cranial nerve deficit. She exhibits normal muscle tone. Coordination normal.  Skin: Skin is warm and dry. No rash noted. She is not diaphoretic.  Psychiatric: She has a normal mood and affect. Her behavior is normal. Judgment and thought content normal.      Assessment & Plan:

## 2015-03-03 NOTE — Patient Instructions (Signed)
We will contact you to schedule your [facility] appointment.

## 2015-03-10 ENCOUNTER — Ambulatory Visit (INDEPENDENT_AMBULATORY_CARE_PROVIDER_SITE_OTHER): Payer: Medicare Other | Admitting: *Deleted

## 2015-03-10 DIAGNOSIS — E538 Deficiency of other specified B group vitamins: Secondary | ICD-10-CM | POA: Diagnosis not present

## 2015-03-10 MED ORDER — CYANOCOBALAMIN 1000 MCG/ML IJ SOLN
1000.0000 ug | Freq: Once | INTRAMUSCULAR | Status: AC
  Administered 2015-03-10: 1000 ug via INTRAMUSCULAR

## 2015-03-15 ENCOUNTER — Telehealth: Payer: Self-pay | Admitting: *Deleted

## 2015-03-15 NOTE — Telephone Encounter (Signed)
Patient called back and scheduled apt

## 2015-03-15 NOTE — Telephone Encounter (Signed)
Received fax from [facility], Per Dr Dan HumphreysWalker pt needs to come in for an OV to complete these forms.  Called pt to schedule appt.  Pt said she will call the office back to schedule the appt

## 2015-03-16 ENCOUNTER — Encounter: Payer: Self-pay | Admitting: Nurse Practitioner

## 2015-03-16 DIAGNOSIS — M7120 Synovial cyst of popliteal space [Baker], unspecified knee: Secondary | ICD-10-CM | POA: Insufficient documentation

## 2015-03-16 DIAGNOSIS — Z09 Encounter for follow-up examination after completed treatment for conditions other than malignant neoplasm: Secondary | ICD-10-CM | POA: Insufficient documentation

## 2015-03-16 NOTE — Assessment & Plan Note (Signed)
Pt is feeling improved, she is scheduled to follow up with Dr. Darrold JunkerParaschos in 1 week and reports following a heart healthy diet.

## 2015-03-16 NOTE — Assessment & Plan Note (Signed)
Pt not in A fib at today's visit. Denies Chest pain nor DOE today. Will follow.

## 2015-03-16 NOTE — Assessment & Plan Note (Signed)
Probable baker's cyst. Will send for US due to large area. FU prn worsening/failure to improve.

## 2015-03-21 ENCOUNTER — Ambulatory Visit: Payer: Medicare Other | Admitting: Internal Medicine

## 2015-03-29 ENCOUNTER — Encounter: Payer: Self-pay | Admitting: Internal Medicine

## 2015-03-29 ENCOUNTER — Ambulatory Visit (INDEPENDENT_AMBULATORY_CARE_PROVIDER_SITE_OTHER): Payer: Medicare Other | Admitting: Internal Medicine

## 2015-03-29 VITALS — BP 114/71 | HR 78 | Temp 97.8°F | Ht 60.0 in | Wt 99.5 lb

## 2015-03-29 DIAGNOSIS — I480 Paroxysmal atrial fibrillation: Secondary | ICD-10-CM | POA: Diagnosis not present

## 2015-03-29 DIAGNOSIS — R64 Cachexia: Secondary | ICD-10-CM

## 2015-03-29 NOTE — Progress Notes (Signed)
Pre visit review using our clinic review tool, if applicable. No additional management support is needed unless otherwise documented below in the visit note. 

## 2015-03-29 NOTE — Progress Notes (Signed)
Subjective:    Patient ID: Elizabeth Horne, female    DOB: 05-09-1926, 81 y.o.   MRN: [id number]  HPI  81YO female presents for follow up.  Feeling well. Continues to feel fatigued at times. Some palpitations with walking. Was admitted for palpitations in 02/2015.Diagnosed with AFIB with RVR. Discussed potentially starting anticoagulation but opted not to given h/o SDH. No recurrent persistent palpitations, chest pain.  Appetite okay. No nausea, vomiting. No change in BM. Eating 3 meals per day.  Wt Readings from Last 3 Encounters:  03/29/15 99 lb 8 oz (45.133 kg)  03/03/15 103 lb (46.72 kg)  02/27/15 103 lb 6.4 oz (46.902 kg)    Past medical, surgical, family and social history per today's encounter.  Review of Systems  Constitutional: Negative for fever, chills, appetite change, fatigue and unexpected weight change.  HENT: Negative for trouble swallowing.   Eyes: Negative for visual disturbance.  Respiratory: Negative for shortness of breath.   Cardiovascular: Positive for palpitations. Negative for chest pain and leg swelling.  Gastrointestinal: Negative for nausea, vomiting, abdominal pain, diarrhea and constipation.  Skin: Negative for color change and rash.  Hematological: Negative for adenopathy. Does not bruise/bleed easily.  Psychiatric/Behavioral: Negative for dysphoric mood. The patient is not nervous/anxious.        Objective:    BP 114/71 mmHg  Pulse 78  Temp(Src) 97.8 F (36.6 C) (Oral)  Ht 5' (1.524 m)  Wt 99 lb 8 oz (45.133 kg)  BMI 19.43 kg/m2  SpO2 97% Physical Exam  Constitutional: She is oriented to person, place, and time. She appears well-developed and well-nourished. No distress.  HENT:  Head: Normocephalic and atraumatic.  Right Ear: External ear normal.  Left Ear: External ear normal.  Nose: Nose normal.  Mouth/Throat: Oropharynx is clear and moist. No oropharyngeal exudate.  Eyes: Conjunctivae are normal. Pupils are equal, round, and  reactive to light. Right eye exhibits no discharge. Left eye exhibits no discharge. No scleral icterus.  Neck: Normal range of motion. Neck supple. No tracheal deviation present. No thyromegaly present.  Cardiovascular: Normal rate, regular rhythm, normal heart sounds and intact distal pulses.  Exam reveals no gallop and no friction rub.   No murmur heard. Pulmonary/Chest: Effort normal and breath sounds normal. No respiratory distress. She has no wheezes. She has no rales. She exhibits no tenderness.  Musculoskeletal: Normal range of motion. She exhibits no edema or tenderness.  Lymphadenopathy:    She has no cervical adenopathy.  Neurological: She is alert and oriented to person, place, and time. No cranial nerve deficit. She exhibits normal muscle tone. Coordination normal.  Skin: Skin is warm and dry. No rash noted. She is not diaphoretic. No erythema. No pallor.  Psychiatric: She has a normal mood and affect. Her behavior is normal. Judgment and thought content normal.          Assessment & Plan:   Problem List Items Addressed This Visit      Unprioritized   Atrial fibrillation - Primary    Rate well controlled without medication at present. Reviewed notes from cardiology. CHADS4. Opted not to start anticoagulation given h/o SDH. Will continue to monitor.      Relevant Orders   Comprehensive metabolic panel   Cachexia    Wt Readings from Last 3 Encounters:  03/29/15 99 lb 8 oz (45.133 kg)  03/03/15 103 lb (46.72 kg)  02/27/15 103 lb 6.4 oz (46.902 kg)   Encouraged increased caloric intake, high protein diet. Will  continue Remeron. Follow up in 3 months and prn.          Return in about 3 months (around 06/29/2015) for Recheck.

## 2015-03-29 NOTE — Patient Instructions (Signed)
Labs today.  Follow up in 3 months and sooner as needed. 

## 2015-03-29 NOTE — Assessment & Plan Note (Signed)
Rate well controlled without medication at present. Reviewed notes from cardiology. CHADS4. Opted not to start anticoagulation given h/o SDH. Will continue to monitor.

## 2015-03-29 NOTE — Assessment & Plan Note (Signed)
Wt Readings from Last 3 Encounters:  03/29/15 99 lb 8 oz (45.133 kg)  03/03/15 103 lb (46.72 kg)  02/27/15 103 lb 6.4 oz (46.902 kg)   Encouraged increased caloric intake, high protein diet. Will continue Remeron. Follow up in 3 months and prn.

## 2015-03-30 LAB — COMPREHENSIVE METABOLIC PANEL
ALT: 9 U/L (ref 0–35)
AST: 23 U/L (ref 0–37)
Albumin: 4.2 g/dL (ref 3.5–5.2)
Alkaline Phosphatase: 72 U/L (ref 39–117)
BUN: 37 mg/dL — AB (ref 6–23)
CO2: 26 mEq/L (ref 19–32)
Calcium: 9.7 mg/dL (ref 8.4–10.5)
Chloride: 103 mEq/L (ref 96–112)
Creatinine, Ser: 1.07 mg/dL (ref 0.40–1.20)
GFR: 51.59 mL/min — AB (ref >60.00)
Glucose, Bld: 114 mg/dL — ABNORMAL HIGH (ref 70–99)
POTASSIUM: 4.9 meq/L (ref 3.5–5.1)
Sodium: 140 mEq/L (ref 135–145)
TOTAL PROTEIN: 6.7 g/dL (ref 6.0–8.3)
Total Bilirubin: 0.7 mg/dL (ref 0.2–1.2)

## 2015-04-12 ENCOUNTER — Ambulatory Visit (INDEPENDENT_AMBULATORY_CARE_PROVIDER_SITE_OTHER): Payer: Medicare Other | Admitting: *Deleted

## 2015-04-12 DIAGNOSIS — E538 Deficiency of other specified B group vitamins: Secondary | ICD-10-CM | POA: Diagnosis not present

## 2015-04-12 MED ORDER — CYANOCOBALAMIN 1000 MCG/ML IJ SOLN
1000.0000 ug | Freq: Once | INTRAMUSCULAR | Status: AC
  Administered 2015-04-12: 1000 ug via INTRAMUSCULAR

## 2015-05-17 ENCOUNTER — Ambulatory Visit: Payer: Medicare Other

## 2015-05-18 ENCOUNTER — Ambulatory Visit (INDEPENDENT_AMBULATORY_CARE_PROVIDER_SITE_OTHER): Payer: Medicare Other | Admitting: *Deleted

## 2015-05-18 DIAGNOSIS — E538 Deficiency of other specified B group vitamins: Secondary | ICD-10-CM | POA: Diagnosis not present

## 2015-05-18 MED ORDER — CYANOCOBALAMIN 1000 MCG/ML IJ SOLN
1000.0000 ug | Freq: Once | INTRAMUSCULAR | Status: AC
  Administered 2015-05-18: 1000 ug via INTRAMUSCULAR

## 2015-05-27 ENCOUNTER — Other Ambulatory Visit: Payer: Self-pay | Admitting: *Deleted

## 2015-05-27 ENCOUNTER — Telehealth: Payer: Self-pay | Admitting: Internal Medicine

## 2015-05-27 MED ORDER — SIMVASTATIN 40 MG PO TABS: 40.0000 mg | ORAL_TABLET | Freq: Every day | ORAL | Status: DC

## 2015-05-27 NOTE — Telephone Encounter (Signed)
rx sent

## 2015-06-13 ENCOUNTER — Other Ambulatory Visit: Payer: Self-pay | Admitting: Internal Medicine

## 2015-06-22 ENCOUNTER — Ambulatory Visit: Payer: Medicare Other | Admitting: Internal Medicine

## 2015-06-29 ENCOUNTER — Ambulatory Visit (INDEPENDENT_AMBULATORY_CARE_PROVIDER_SITE_OTHER): Payer: Medicare Other | Admitting: Internal Medicine

## 2015-06-29 ENCOUNTER — Encounter: Payer: Self-pay | Admitting: Internal Medicine

## 2015-06-29 VITALS — BP 143/74 | HR 79 | Temp 98.0°F | Ht 60.0 in | Wt 96.1 lb

## 2015-06-29 DIAGNOSIS — I480 Paroxysmal atrial fibrillation: Secondary | ICD-10-CM

## 2015-06-29 DIAGNOSIS — E538 Deficiency of other specified B group vitamins: Secondary | ICD-10-CM

## 2015-06-29 DIAGNOSIS — Z23 Encounter for immunization: Secondary | ICD-10-CM

## 2015-06-29 DIAGNOSIS — M810 Age-related osteoporosis without current pathological fracture: Secondary | ICD-10-CM

## 2015-06-29 DIAGNOSIS — R64 Cachexia: Secondary | ICD-10-CM | POA: Diagnosis not present

## 2015-06-29 DIAGNOSIS — E038 Other specified hypothyroidism: Secondary | ICD-10-CM | POA: Diagnosis not present

## 2015-06-29 DIAGNOSIS — I10 Essential (primary) hypertension: Secondary | ICD-10-CM

## 2015-06-29 LAB — CBC WITH DIFFERENTIAL/PLATELET
Basophils Absolute: 0 10*3/uL (ref 0.0–0.1)
Basophils Relative: 0.5 % (ref 0.0–3.0)
EOS ABS: 0.2 10*3/uL (ref 0.0–0.7)
EOS PCT: 1.8 % (ref 0.0–5.0)
HCT: 40.5 % (ref 36.0–46.0)
Hemoglobin: 13.1 g/dL (ref 12.0–15.0)
LYMPHS ABS: 1.6 10*3/uL (ref 0.7–4.0)
Lymphocytes Relative: 18.7 % (ref 12.0–46.0)
MCHC: 32.5 g/dL (ref 30.0–36.0)
MCV: 95.8 fl (ref 78.0–100.0)
MONO ABS: 0.8 10*3/uL (ref 0.1–1.0)
MONOS PCT: 9.1 % (ref 3.0–12.0)
NEUTROS PCT: 69.9 % (ref 43.0–77.0)
Neutro Abs: 6.2 10*3/uL (ref 1.4–7.7)
Platelets: 208 10*3/uL (ref 150.0–400.0)
RBC: 4.22 Mil/uL (ref 3.87–5.11)
RDW: 13.9 % (ref 11.5–15.5)
WBC: 8.8 10*3/uL (ref 4.0–10.5)

## 2015-06-29 LAB — COMPREHENSIVE METABOLIC PANEL
ALT: 13 U/L (ref 0–35)
AST: 28 U/L (ref 0–37)
Albumin: 4.1 g/dL (ref 3.5–5.2)
Alkaline Phosphatase: 66 U/L (ref 39–117)
BUN: 37 mg/dL — AB (ref 6–23)
CHLORIDE: 102 meq/L (ref 96–112)
CO2: 30 meq/L (ref 19–32)
CREATININE: 1.22 mg/dL — AB (ref 0.40–1.20)
Calcium: 9.7 mg/dL (ref 8.4–10.5)
GFR: 44.31 mL/min — ABNORMAL LOW (ref >60.00)
GLUCOSE: 115 mg/dL — AB (ref 70–99)
Potassium: 4.5 mEq/L (ref 3.5–5.1)
SODIUM: 140 meq/L (ref 135–145)
Total Bilirubin: 0.6 mg/dL (ref 0.2–1.2)
Total Protein: 6.6 g/dL (ref 6.0–8.3)

## 2015-06-29 LAB — TSH: TSH: 1.95 u[IU]/mL (ref 0.35–4.50)

## 2015-06-29 MED ORDER — CYANOCOBALAMIN 1000 MCG/ML IJ SOLN
1000.0000 ug | Freq: Once | INTRAMUSCULAR | Status: AC
  Administered 2015-06-29: 1000 ug via INTRAMUSCULAR

## 2015-06-29 NOTE — Progress Notes (Signed)
Pre visit review using our clinic review tool, if applicable. No additional management support is needed unless otherwise documented below in the visit note. 

## 2015-06-29 NOTE — Assessment & Plan Note (Signed)
Wt Readings from Last 3 Encounters:  06/29/15 96 lb 2 oz (43.602 kg)  03/29/15 99 lb 8 oz (45.133 kg)  03/03/15 103 lb (46.72 kg)   Weight is down some today, however she reports appetite is good. Will continue Remeron. Check thyroid function and CMP, CBC with labs. Follow up in 3 months. Consider increase in dose of Remeron at next visit.

## 2015-06-29 NOTE — Patient Instructions (Signed)
Labs today.  Follow up in 3 months and as needed. 

## 2015-06-29 NOTE — Assessment & Plan Note (Signed)
BP Readings from Last 3 Encounters:  06/29/15 143/74  03/29/15 114/71  03/03/15 110/68   BP generally well controlled. Continue current medications. Renal function with labs.

## 2015-06-29 NOTE — Assessment & Plan Note (Signed)
NSR on exam today. Suspect episodes of palpitations paroxysmal afib. CHADS4. She has opted not to start anticoagulation given h/o SDH. Will continue to monitor.

## 2015-06-29 NOTE — Assessment & Plan Note (Signed)
Pt has been taken off Fosamax. Unclear why. Will repeat bone density testing. Order placed.

## 2015-06-29 NOTE — Progress Notes (Signed)
Subjective:    Patient ID: Elizabeth Horne, female    DOB: Nov 20, 1925, 81 y.o.   MRN: [id number]  HPI  81YO female presents for follow up.  Feeling well. Appetite has been good. No recent illnesses. Traveled to beach last week. Energy level is good. Compliant with medications. No recent illnesses. Mood has been good.  AFIB - occasional palpitations, worsened on exertion. Fleeting and last only a few moments. Resolve with rest. No chest pain. No dyspnea.  Wt Readings from Last 3 Encounters:  06/29/15 96 lb 2 oz (43.602 kg)  03/29/15 99 lb 8 oz (45.133 kg)  03/03/15 103 lb (46.72 kg)   BP Readings from Last 3 Encounters:  06/29/15 143/74  03/29/15 114/71  03/03/15 110/68    Past Medical History  Diagnosis Date  . Arthritis   . Hypertension   . Hyperlipidemia   . Thyroid disease    Family History  Problem Relation Age of Onset  . Lung disease Sister    Past Surgical History  Procedure Laterality Date  . Tonsillectomy and adenoidectomy    . Total hip arthroplasty Bilateral   . Thyroidectomy, partial      benign  . Total shoulder arthroplasty Bilateral   . Kyphoplasty  2015    x 2, Dr. Rosita Kea  . Abdominal hysterectomy      menorrhagia  . Vaginal delivery      2  . Joint replacement    . Craniotomy     Social History   Social History  . Marital Status: Widowed    Spouse Name: N/A  . Number of Children: N/A  . Years of Education: N/A   Social History Main Topics  . Smoking status: Former Smoker    Types: Cigarettes    Quit date: 06/09/1987  . Smokeless tobacco: Never Used  . Alcohol Use: 0.6 oz/week    1 Glasses of wine per week     Comment: daily glass of wine or 2  . Drug Use: No  . Sexual Activity: Not Asked   Other Topics Concern  . None   Social History Narrative   Lives in [place]. Has 10hr assistance during the day.      Two children.          Review of Systems  Constitutional: Negative for fever, chills, appetite change,  fatigue and unexpected weight change.  HENT: Positive for congestion, postnasal drip and rhinorrhea. Negative for sinus pressure, sore throat, trouble swallowing and voice change.   Eyes: Negative for visual disturbance.  Respiratory: Negative for cough, chest tightness and shortness of breath.   Cardiovascular: Positive for palpitations. Negative for chest pain and leg swelling.  Gastrointestinal: Negative for nausea, vomiting, abdominal pain, diarrhea and constipation.  Musculoskeletal: Negative for myalgias and arthralgias.  Skin: Negative for color change and rash.  Hematological: Negative for adenopathy. Does not bruise/bleed easily.  Psychiatric/Behavioral: Negative for suicidal ideas, sleep disturbance and dysphoric mood. The patient is not nervous/anxious.        Objective:    BP 143/74 mmHg  Pulse 79  Temp(Src) 98 F (36.7 C) (Oral)  Ht 5' (1.524 m)  Wt 96 lb 2 oz (43.602 kg)  BMI 18.77 kg/m2  SpO2 95% Physical Exam  Constitutional: She is oriented to person, place, and time. She appears well-developed and well-nourished. No distress.  HENT:  Head: Normocephalic and atraumatic.  Right Ear: External ear normal.  Left Ear: External ear normal.  Nose: Nose normal.  Mouth/Throat: Oropharynx is  clear and moist. No oropharyngeal exudate.  Eyes: Conjunctivae are normal. Pupils are equal, round, and reactive to light. Right eye exhibits no discharge. Left eye exhibits no discharge. No scleral icterus.  Neck: Normal range of motion. Neck supple. No tracheal deviation present. No thyromegaly present.  Cardiovascular: Normal rate, regular rhythm, normal heart sounds and intact distal pulses.  Exam reveals no gallop and no friction rub.   No murmur heard. Pulmonary/Chest: Effort normal and breath sounds normal. No respiratory distress. She has no wheezes. She has no rales. She exhibits no tenderness.  Musculoskeletal: Normal range of motion. She exhibits no edema or tenderness.    Lymphadenopathy:    She has no cervical adenopathy.  Neurological: She is alert and oriented to person, place, and time. No cranial nerve deficit. She exhibits normal muscle tone. Coordination normal.  Skin: Skin is warm and dry. No rash noted. She is not diaphoretic. No erythema. No pallor.  Psychiatric: She has a normal mood and affect. Her behavior is normal. Judgment and thought content normal.          Assessment & Plan:   Problem List Items Addressed This Visit      Unprioritized   Atrial fibrillation (HCC)    NSR on exam today. Suspect episodes of palpitations paroxysmal afib. CHADS4. She has opted not to start anticoagulation given h/o SDH. Will continue to monitor.      BP (high blood pressure)    BP Readings from Last 3 Encounters:  06/29/15 143/74  03/29/15 114/71  03/03/15 110/68   BP generally well controlled. Continue current medications. Renal function with labs.      Relevant Orders   Comprehensive metabolic panel   Cachexia (HCC) - Primary    Wt Readings from Last 3 Encounters:  06/29/15 96 lb 2 oz (43.602 kg)  03/29/15 99 lb 8 oz (45.133 kg)  03/03/15 103 lb (46.72 kg)   Weight is down some today, however she reports appetite is good. Will continue Remeron. Check thyroid function and CMP, CBC with labs. Follow up in 3 months. Consider increase in dose of Remeron at next visit.      Relevant Orders   CBC with Differential/Platelet   Hypothyroidism, secondary    Repeat TSH with labs today. Continue Levothyroxine.      Relevant Orders   TSH   Osteoporosis, post-menopausal    Pt has been taken off Fosamax. Unclear why. Will repeat bone density testing. Order placed.      Relevant Orders   DG Bone Density       Return in about 3 months (around 09/29/2015) for Recheck.

## 2015-06-29 NOTE — Addendum Note (Signed)
Addended by: Marchia MeiersEASTWOOD, Shakedra Beam M on: 06/29/2015 02:48 PM   Modules accepted: Orders

## 2015-06-29 NOTE — Assessment & Plan Note (Signed)
Repeat TSH with labs today. Continue Levothyroxine. 

## 2015-07-04 ENCOUNTER — Other Ambulatory Visit: Payer: Self-pay | Admitting: Internal Medicine

## 2015-07-04 ENCOUNTER — Telehealth: Payer: Self-pay | Admitting: Internal Medicine

## 2015-07-04 NOTE — Telephone Encounter (Signed)
Called pt to advise that she had a B12 shot and Influenza vaccination on her last visit 10/26.msn

## 2015-07-06 ENCOUNTER — Other Ambulatory Visit (INDEPENDENT_AMBULATORY_CARE_PROVIDER_SITE_OTHER): Payer: Medicare Other

## 2015-07-06 DIAGNOSIS — E860 Dehydration: Secondary | ICD-10-CM | POA: Diagnosis not present

## 2015-07-07 LAB — BASIC METABOLIC PANEL
BUN: 34 mg/dL — ABNORMAL HIGH (ref 6–23)
CHLORIDE: 103 meq/L (ref 96–112)
CO2: 30 meq/L (ref 19–32)
Calcium: 9.5 mg/dL (ref 8.4–10.5)
Creatinine, Ser: 1.21 mg/dL — ABNORMAL HIGH (ref 0.40–1.20)
GFR: 44.73 mL/min — ABNORMAL LOW (ref >60.00)
Glucose, Bld: 97 mg/dL (ref 70–99)
Potassium: 5.1 mEq/L (ref 3.5–5.1)
Sodium: 141 mEq/L (ref 135–145)

## 2015-07-11 ENCOUNTER — Other Ambulatory Visit (INDEPENDENT_AMBULATORY_CARE_PROVIDER_SITE_OTHER): Payer: Medicare Other

## 2015-07-11 ENCOUNTER — Telehealth: Payer: Self-pay | Admitting: *Deleted

## 2015-07-11 ENCOUNTER — Ambulatory Visit (INDEPENDENT_AMBULATORY_CARE_PROVIDER_SITE_OTHER): Payer: Medicare Other

## 2015-07-11 DIAGNOSIS — I10 Essential (primary) hypertension: Secondary | ICD-10-CM | POA: Diagnosis not present

## 2015-07-11 DIAGNOSIS — Z139 Encounter for screening, unspecified: Secondary | ICD-10-CM

## 2015-07-11 LAB — BASIC METABOLIC PANEL
BUN: 28 mg/dL — AB (ref 6–23)
CHLORIDE: 102 meq/L (ref 96–112)
CO2: 29 mEq/L (ref 19–32)
CREATININE: 0.97 mg/dL (ref 0.40–1.20)
Calcium: 9.9 mg/dL (ref 8.4–10.5)
GFR: 57.73 mL/min — ABNORMAL LOW (ref >60.00)
GLUCOSE: 78 mg/dL (ref 70–99)
Potassium: 4.6 mEq/L (ref 3.5–5.1)
Sodium: 141 mEq/L (ref 135–145)

## 2015-07-11 LAB — HM DEXA SCAN

## 2015-07-11 NOTE — Progress Notes (Signed)
Error

## 2015-07-11 NOTE — Telephone Encounter (Signed)
Labs and dx?  

## 2015-07-11 NOTE — Telephone Encounter (Signed)
Not from me, didn't know she had to get one done

## 2015-07-11 NOTE — Telephone Encounter (Signed)
Can you ask her to stop by sometime this week to recheck BP? I just wanted to recheck because we had stopped the BP medication

## 2015-07-11 NOTE — Telephone Encounter (Signed)
BMP. Did she get a blood pressure check as well?

## 2015-07-12 NOTE — Telephone Encounter (Signed)
Pt is scheduled for BP check

## 2015-07-13 ENCOUNTER — Ambulatory Visit (INDEPENDENT_AMBULATORY_CARE_PROVIDER_SITE_OTHER): Payer: Medicare Other

## 2015-07-13 ENCOUNTER — Telehealth: Payer: Self-pay | Admitting: Internal Medicine

## 2015-07-13 VITALS — BP 122/80 | HR 71

## 2015-07-13 DIAGNOSIS — I10 Essential (primary) hypertension: Secondary | ICD-10-CM | POA: Diagnosis not present

## 2015-07-13 NOTE — Progress Notes (Signed)
Patient was called .

## 2015-07-13 NOTE — Progress Notes (Signed)
Patient is getting a BP recheck. It was taken on left arm.

## 2015-07-13 NOTE — Telephone Encounter (Signed)
Bone density testing showed osteoporosis with T-score of -3.3. This was a decline from previous. I would recommend that we set up a follow up visit to discuss potential treatments.

## 2015-07-13 NOTE — Telephone Encounter (Signed)
Please give pt results when she comes in for nurse visit

## 2015-07-13 NOTE — Progress Notes (Signed)
BP well controlled. Continue off Losartan.

## 2015-07-13 NOTE — Telephone Encounter (Signed)
Patient was seen by Ashleigh newton.

## 2015-07-31 ENCOUNTER — Other Ambulatory Visit: Payer: Self-pay | Admitting: Internal Medicine

## 2015-08-03 ENCOUNTER — Ambulatory Visit: Payer: Medicare Other | Admitting: Internal Medicine

## 2015-08-19 ENCOUNTER — Ambulatory Visit: Payer: Medicare Other | Admitting: Internal Medicine

## 2015-08-26 ENCOUNTER — Telehealth: Payer: Self-pay | Admitting: *Deleted

## 2015-08-26 ENCOUNTER — Emergency Department
Admission: EM | Admit: 2015-08-26 | Discharge: 2015-08-26 | Disposition: A | Discharge status: Home / Self Care | Payer: Medicare Other | Attending: Emergency Medicine | Admitting: Emergency Medicine

## 2015-08-26 ENCOUNTER — Encounter: Payer: Self-pay | Admitting: Emergency Medicine

## 2015-08-26 ENCOUNTER — Encounter: Payer: Self-pay | Admitting: Internal Medicine

## 2015-08-26 ENCOUNTER — Emergency Department: Payer: Medicare Other

## 2015-08-26 ENCOUNTER — Ambulatory Visit (INDEPENDENT_AMBULATORY_CARE_PROVIDER_SITE_OTHER): Payer: Medicare Other | Admitting: Internal Medicine

## 2015-08-26 VITALS — BP 124/80 | HR 157 | Resp 20 | Ht 60.0 in | Wt 98.1 lb

## 2015-08-26 DIAGNOSIS — Z79899 Other long term (current) drug therapy: Secondary | ICD-10-CM | POA: Insufficient documentation

## 2015-08-26 DIAGNOSIS — Z87891 Personal history of nicotine dependence: Secondary | ICD-10-CM | POA: Insufficient documentation

## 2015-08-26 DIAGNOSIS — I10 Essential (primary) hypertension: Secondary | ICD-10-CM | POA: Diagnosis not present

## 2015-08-26 DIAGNOSIS — R002 Palpitations: Secondary | ICD-10-CM | POA: Diagnosis not present

## 2015-08-26 DIAGNOSIS — I4891 Unspecified atrial fibrillation: Secondary | ICD-10-CM | POA: Diagnosis not present

## 2015-08-26 DIAGNOSIS — Z7982 Long term (current) use of aspirin: Secondary | ICD-10-CM | POA: Diagnosis not present

## 2015-08-26 DIAGNOSIS — R0602 Shortness of breath: Secondary | ICD-10-CM | POA: Diagnosis present

## 2015-08-26 HISTORY — DX: Unspecified atrial fibrillation: I48.91

## 2015-08-26 LAB — CBC WITH DIFFERENTIAL/PLATELET
Basophils Absolute: 0.1 10*3/uL (ref 0–0.1)
Basophils Relative: 1 %
Eosinophils Absolute: 0.1 10*3/uL (ref 0–0.7)
Eosinophils Relative: 1 %
HEMATOCRIT: 43.1 % (ref 35.0–47.0)
HEMOGLOBIN: 14.1 g/dL (ref 12.0–16.0)
LYMPHS ABS: 1.1 10*3/uL (ref 1.0–3.6)
LYMPHS PCT: 14 %
MCH: 31.1 pg (ref 26.0–34.0)
MCHC: 32.7 g/dL (ref 32.0–36.0)
MCV: 94.9 fL (ref 80.0–100.0)
MONOS PCT: 6 %
Monocytes Absolute: 0.5 10*3/uL (ref 0.2–0.9)
NEUTROS ABS: 6.2 10*3/uL (ref 1.4–6.5)
NEUTROS PCT: 78 %
Platelets: 176 10*3/uL (ref 150–440)
RBC: 4.54 MIL/uL (ref 3.80–5.20)
RDW: 13.2 % (ref 11.5–14.5)
WBC: 7.9 10*3/uL (ref 3.6–11.0)

## 2015-08-26 LAB — BASIC METABOLIC PANEL
Anion gap: 10 (ref 5–15)
BUN: 37 mg/dL — AB (ref 6–20)
CHLORIDE: 104 mmol/L (ref 101–111)
CO2: 26 mmol/L (ref 22–32)
Calcium: 9.7 mg/dL (ref 8.9–10.3)
Creatinine, Ser: 0.98 mg/dL (ref 0.44–1.00)
GFR calc Af Amer: 58 mL/min — ABNORMAL LOW (ref >60)
GFR calc non Af Amer: 50 mL/min — ABNORMAL LOW (ref >60)
GLUCOSE: 91 mg/dL (ref 65–99)
POTASSIUM: 4.6 mmol/L (ref 3.5–5.1)
SODIUM: 140 mmol/L (ref 135–145)

## 2015-08-26 LAB — TSH: TSH: 4.756 u[IU]/mL — ABNORMAL HIGH (ref 0.350–4.500)

## 2015-08-26 LAB — TROPONIN I: Troponin I: 0.04 ng/mL — ABNORMAL HIGH (ref <0.031)

## 2015-08-26 LAB — BRAIN NATRIURETIC PEPTIDE: B NATRIURETIC PEPTIDE 5: 787 pg/mL — AB (ref 0.0–100.0)

## 2015-08-26 MED ORDER — DILTIAZEM HCL 30 MG PO TABS
30.0000 mg | ORAL_TABLET | Freq: Once | ORAL | Status: AC
  Administered 2015-08-26: 30 mg via ORAL
  Filled 2015-08-26: qty 1

## 2015-08-26 MED ORDER — DILTIAZEM HCL 25 MG/5ML IV SOLN
10.0000 mg | Freq: Once | INTRAVENOUS | Status: AC
  Administered 2015-08-26: 10 mg via INTRAVENOUS
  Filled 2015-08-26: qty 5

## 2015-08-26 MED ORDER — DILTIAZEM HCL ER COATED BEADS 120 MG PO CP24: 120.0000 mg | ORAL_CAPSULE | Freq: Once | ORAL | Status: DC

## 2015-08-26 MED ORDER — DILTIAZEM HCL ER COATED BEADS 120 MG PO CP24
120.0000 mg | ORAL_CAPSULE | Freq: Once | ORAL | Status: AC
  Administered 2015-08-26: 120 mg via ORAL
  Filled 2015-08-26: qty 1

## 2015-08-26 NOTE — ED Notes (Signed)
Pt arrived via EMS from [facility]. Pt was there to be evaluated for her breathing. EMS was called due to shortness of breath and pt was in afib.  Office told EMS her HR was in the 130-160s. EMS states she was in afib for them but HR was 67-130.   Has a history of afib.  Pt states she started having fluttering in her chest yesterday.

## 2015-08-26 NOTE — Telephone Encounter (Signed)
Put daughter in law  called wanting to schedule a  ER follow up for next week because of the medicine.Benay Pillow. Contact pt daughter in law [phone]

## 2015-08-26 NOTE — Progress Notes (Signed)
Subjective:    Patient ID: Elizabeth Horne, female    DOB: 30-Dec-1925, 81 y.o.   MRN: [id number]  HPI  81YO female presents for acute visit.  Palpitations - Previous visits here and with cardiology, HR in 70-80s. Hospitalized in past with AFIB with RVR, converted to NSR with cardioversion. Has been off anticoagulation because of history of SDH. On aspirin only.  Starting yesterday, noted rapid heart rate and dyspnea. Denies chest pain. Was able to sleep last night.  Having trouble speaking in full sentences.  Wt Readings from Last 3 Encounters:  08/26/15 98 lb 2 oz (44.509 kg)  06/29/15 96 lb 2 oz (43.602 kg)  03/29/15 99 lb 8 oz (45.133 kg)   BP Readings from Last 3 Encounters:  08/26/15 124/80  07/13/15 122/80  06/29/15 143/74    Past Medical History  Diagnosis Date  . Arthritis   . Hypertension   . Hyperlipidemia   . Thyroid disease    Family History  Problem Relation Age of Onset  . Lung disease Sister    Past Surgical History  Procedure Laterality Date  . Tonsillectomy and adenoidectomy    . Total hip arthroplasty Bilateral   . Thyroidectomy, partial      benign  . Total shoulder arthroplasty Bilateral   . Kyphoplasty  2015    x 2, Dr. Rosita Kea  . Abdominal hysterectomy      menorrhagia  . Vaginal delivery      2  . Joint replacement    . Craniotomy     Social History   Social History  . Marital Status: Widowed    Spouse Name: N/A  . Number of Children: N/A  . Years of Education: N/A   Social History Main Topics  . Smoking status: Former Smoker    Types: Cigarettes    Quit date: 06/09/1987  . Smokeless tobacco: Never Used  . Alcohol Use: 0.6 oz/week    1 Glasses of wine per week     Comment: daily glass of wine or 2  . Drug Use: No  . Sexual Activity: Not Asked   Other Topics Concern  . None   Social History Narrative   Lives in [place]. Has 10hr assistance during the day.      Two children.          Review of Systems    Constitutional: Positive for fatigue. Negative for fever, chills, appetite change and unexpected weight change.  Eyes: Negative for visual disturbance.  Respiratory: Positive for shortness of breath.   Cardiovascular: Positive for palpitations. Negative for chest pain and leg swelling.  Gastrointestinal: Negative for abdominal pain.  Skin: Negative for color change and rash.  Hematological: Negative for adenopathy. Does not bruise/bleed easily.  Psychiatric/Behavioral: Negative for dysphoric mood. The patient is not nervous/anxious.        Objective:    BP 124/80 mmHg  Pulse 157  Temp(Src)   Resp 20  Ht 5' (1.524 m)  Wt 98 lb 2 oz (44.509 kg)  BMI 19.16 kg/m2  SpO2 96% Physical Exam  Constitutional: She is oriented to person, place, and time. She appears well-developed and well-nourished. No distress.  HENT:  Head: Normocephalic and atraumatic.  Right Ear: External ear normal.  Left Ear: External ear normal.  Nose: Nose normal.  Mouth/Throat: Oropharynx is clear and moist. No oropharyngeal exudate.  Eyes: Conjunctivae are normal. Pupils are equal, round, and reactive to light. Right eye exhibits no discharge. Left eye exhibits no discharge.  No scleral icterus.  Neck: Normal range of motion. Neck supple. No tracheal deviation present. No thyromegaly present.  Cardiovascular: Normal heart sounds and intact distal pulses.  An irregularly irregular rhythm present. Tachycardia present.  Exam reveals no gallop and no friction rub.   No murmur heard. Pulmonary/Chest: Breath sounds normal. Accessory muscle usage present. Tachypnea noted. She is in respiratory distress. She has no decreased breath sounds. She has no wheezes. She has no rhonchi. She has no rales. She exhibits no tenderness.  Musculoskeletal: Normal range of motion. She exhibits no edema or tenderness.  Lymphadenopathy:    She has no cervical adenopathy.  Neurological: She is alert and oriented to person, place, and time.  No cranial nerve deficit. She exhibits normal muscle tone. Coordination normal.  Skin: Skin is warm and dry. No rash noted. She is not diaphoretic. No erythema. No pallor.  Psychiatric: She has a normal mood and affect. Her behavior is normal. Judgment and thought content normal.          Assessment & Plan:   Problem List Items Addressed This Visit      Unprioritized   Atrial fibrillation with rapid ventricular response (HCC)    Symptoms, exam, EKG c/w afib with RVR. Started 2L oxygen [place] in office. Will send to ER via EMS for evaluation, cardiac markers. May need repeat cardioversion. Daughter is with pt and aware. Follow up after eval.       Other Visit Diagnoses    Palpitations    -  Primary    Relevant Orders    EKG 12-Lead (Completed)        No Follow-up on file.

## 2015-08-26 NOTE — ED Provider Notes (Signed)
[facility] Emergency Department Provider Note  ____________________________________________  Time seen: 1120  I have reviewed the triage vital signs and the nursing notes.  History by:  Primarily from patient  HISTORY  Chief Complaint Shortness of Breath  palpitations    HPI Elizabeth Horne is a 81 y.o. female who woke this morning with a sense of palpitations. She reports her heart was pounding quickly and hard. She initially denies shortness of breath to me, speaks that her breathing is better now. Apparently she was somewhat short of breath earlier, especially with exertion.  The patient called her primary physician, Ronna PolioJennifer Walker, at [facility]. She was seen there and noted to be in atrial fibrillation with a rate that range at to 130. The patient has a history of prior atrial fibrillation. She is not on any medications for rate control.  Patient does deny any chest pain or near-syncope.    Past Medical History  Diagnosis Date  . Arthritis   . Hypertension   . Hyperlipidemia   . Thyroid disease   . A-fib [facility]HCC)     Patient Active Problem List   Diagnosis Date Noted  . Atrial fibrillation with rapid ventricular response (HCC) 08/26/2015  . Hospital discharge follow-up 03/16/2015  . Baker's cyst of knee 03/16/2015  . Memory loss 08/19/2014  . Adjustment disorder with mixed anxiety and depressed mood 07/07/2014  . HLD (hyperlipidemia) 06/08/2014  . BP (high blood pressure) 06/08/2014  . Hypothyroidism, secondary 06/08/2014  . Osteoporosis, post-menopausal 06/08/2014  . Intracranial subdural hematoma (HCC) 06/08/2014  . Basilar artery insufficiency 06/08/2014  . Allergic rhinitis 06/08/2014  . Atrial fibrillation (HCC) 06/08/2014  . Cachexia (HCC) 06/08/2014  . H/O surgical procedure 01/29/2014  . Wedge fracture of thoracic vertebra (HCC) 01/29/2014  . CN (constipation) 01/29/2014  . Bronchitis, chronic (HCC) 01/11/2014  . H/O  gastrointestinal disease 01/11/2014  . Arthritis, degenerative 01/11/2014    Past Surgical History  Procedure Laterality Date  . Tonsillectomy and adenoidectomy    . Total hip arthroplasty Bilateral   . Thyroidectomy, partial      benign  . Total shoulder arthroplasty Bilateral   . Kyphoplasty  2015    x 2, Dr. Rosita KeaMenz  . Abdominal hysterectomy      menorrhagia  . Vaginal delivery      2  . Joint replacement    . Craniotomy      Current Outpatient Rx  Name  Route  Sig  Dispense  Refill  . aspirin 81 MG tablet   Oral   Take 81 mg by mouth daily.         . calcium-vitamin D 250-100 MG-UNIT per tablet   Oral   Take 1 tablet by mouth daily.         . cetirizine (ZYRTEC) 10 MG tablet   Oral   Take 10 mg by mouth daily as needed for allergies or rhinitis.          . cyanocobalamin (,VITAMIN B-12,) 1000 MCG/ML injection      INJECT 1 ML IM EACH MONTH   30 mL   11   . diltiazem (CARDIZEM CD) 120 MG 24 hr capsule   Oral   Take 1 capsule (120 mg total) by mouth once.   30 capsule   0   . levothyroxine (SYNTHROID, LEVOTHROID) 75 MCG tablet      TAKE ONE TABLET EVERY DAY MUST BE SEEN FOR FURTHER REFILLS   30 tablet  1     Must keep appt in January for further refills   . losartan-hydrochlorothiazide (HYZAAR) 50-12.5 MG per tablet      TAKE ONE TABLET BY MOUTH EVERY DAY   90 tablet   1   . meloxicam (MOBIC) 15 MG tablet   Oral   Take 15 mg by mouth daily.         . mirtazapine (REMERON) 7.5 MG tablet      TAKE ONE TABLET BY MOUTH AT BEDTIME   30 tablet   2     FOR FUTURE FILL THANKS   . omeprazole (PRILOSEC) 20 MG capsule      TAKE 1 CAPSULE EVERY DAY   30 capsule   5   . simvastatin (ZOCOR) 40 MG tablet      TAKE ONE TABLET EVERY DAY   30 tablet   2     Allergies Review of patient's allergies indicates no known allergies.  Family History  Problem Relation Age of Onset  . Lung disease Sister     Social History Social History   Substance Use Topics  . Smoking status: Former Smoker    Types: Cigarettes    Quit date: 06/09/1987  . Smokeless tobacco: Never Used  . Alcohol Use: 0.6 oz/week    1 Glasses of wine per week     Comment: daily glass of wine or 2    Review of Systems  Constitutional: Negative for fever/chills. ENT: Negative for congestion. Cardiovascular: Palpitations. History of A. fib. Currently with RVR.Marland Kitchen Respiratory: Negative for cough. Shortness of breath with exertion this morning. Gastrointestinal: Negative for abdominal pain, vomiting and diarrhea. Genitourinary: Negative for dysuria. Musculoskeletal: No back pain. Skin: Negative for rash. Neurological: Negative for headache or focal weakness   10-point ROS otherwise negative.  ____________________________________________   PHYSICAL EXAM:  VITAL SIGNS: ED Triage Vitals  Enc Vitals Group     BP 08/26/15 1057 154/117 mmHg     Pulse Rate 08/26/15 1057 122     Resp 08/26/15 1057 16     Temp 08/26/15 1057 97.4 F (36.3 C)     Temp Source 08/26/15 1057 Oral     SpO2 08/26/15 1057 90 %     Weight 08/26/15 1057 98 lb (44.453 kg)     Height 08/26/15 1057  (1.651 m)     Head Cir --      Peak Flow --      Pain Score 08/26/15 1056 0     Pain Loc --      Pain Edu? --      Excl. in GC? --     Constitutional:  Alert and oriented. Quite thin. No acute distress. ENT   Head: Normocephalic and atraumatic.   Nose: No congestion/rhinnorhea.       Mouth: No erythema, no swelling   Cardiovascular: Tachycardic, irregular, at a rate of 112-120 Respiratory:  Normal respiratory effort, no tachypnea.    Breath sounds are clear and equal bilaterally.  Gastrointestinal: Soft, no distention. Nontender Back: No muscle spasm, no tenderness, no CVA tenderness. Musculoskeletal: Notably thin but without deformity. Nontender with normal range of motion in all extremities.  No noted edema. Neurologic:  Communicative. Normal appearing  spontaneous movement in all 4 extremities. No gross focal neurologic deficits are appreciated.  Skin:  Skin is warm, dry. No rash noted. Psychiatric: Mood and affect are normal. Speech and behavior are normal.  ____________________________________________    LABS (pertinent positives/negatives)  Labs Reviewed  BASIC METABOLIC PANEL - Abnormal; Notable for the following:    BUN 37 (*)    GFR calc non Af Amer 50 (*)    GFR calc Af Amer 58 (*)    All other components within normal limits  BRAIN NATRIURETIC PEPTIDE - Abnormal; Notable for the following:    B Natriuretic Peptide 787.0 (*)    All other components within normal limits  TROPONIN I - Abnormal; Notable for the following:    Troponin I 0.04 (*)    All other components within normal limits  CBC WITH DIFFERENTIAL/PLATELET  TSH     ____________________________________________   EKG  ED ECG REPORT I, Miguel Medal W, the attending physician, personally viewed and interpreted this ECG.   Date: 08/26/2015  EKG Time: 1101  Rate: 133  Rhythm:Atrial fibrillation  with rapid ventricular rate   Axis: -62  Intervals: Normal  ST&T Change: None   ____________________________________________    RADIOLOGY  Chest x-ray:  IMPRESSION: 1. Mild bibasilar atelectasis. No acute cardiopulmonary disease otherwise. 2. Stable chronic small bilateral pleural effusions. 3. Stable cardiomegaly without pulmonary edema.  ____________________________________________   PROCEDURES    ____________________________________________   INITIAL IMPRESSION / ASSESSMENT AND PLAN / ED COURSE  Pertinent labs & imaging results that were available during my care of the patient were reviewed by me and considered in my medical decision making (see chart for details).  Patient appears well in no acute distress. She is tachycardic at 110 to 1:30 in the emergency Department. We will attempt rate control with diltiazem, 10 mg IV and 30 mg by  mouth. We will continue to titrate these medications. If she is easily rate controlled, and the rest of her evaluation is unremarkable, we may be able to discharge her home. This is the patient's strong preference. If it is more difficult to get her rate controlled, she may need to stay in the hospital overnight and she understands this.  ----------------------------------------- 12:32 PM on 08/26/2015 -----------------------------------------  Patient is resting, napping. Her rate is in the 80s. We will continue to observe and recheck.  ----------------------------------------- 1:33 PM on 08/26/2015 -----------------------------------------  Patient is awake and sitting in a chair by her bed. Her heart rate is in the 90s up to 102. She continues to be rate controlled. Her blood pressure is 152/111.  Her troponin level is 0.04. This essentially nondiagnostic and borderline.  Patient feels well and looks comfortable. She would like to go home. I'm calling her primary physician, Dr. Ronna Polio, to discuss ongoing treatment.   ----------------------------------------- 1:51 PM on 08/26/2015 -----------------------------------------  Dr. Dan Humphreys is no longer in the office today. Apparently she is not feeling well. I discussed the case with one of her office personnel and they will relay the message to her. Dr. Dan Humphreys can follow-up with the patient.  ____________________________________________   FINAL CLINICAL IMPRESSION(S) / ED DIAGNOSES  Final diagnoses:  Atrial fibrillation with rapid ventricular response (HCC)      Darien Ramus, MD 08/26/15 1351

## 2015-08-26 NOTE — Telephone Encounter (Signed)
2pm on Dec 29th? 30min

## 2015-08-26 NOTE — Telephone Encounter (Signed)
FYI: ER physician called to report that Ms. Elizabeth Horne was in a-fib upon arrival & rate is now controlled. Pt has requested to go home & will be discharged to continue Diltiazem outpatient. Will need to f/u with PCP  to monitor.

## 2015-08-26 NOTE — Telephone Encounter (Signed)
Please advise 

## 2015-08-26 NOTE — Discharge Instructions (Signed)
Your heart went back in atrial fibrillation today with a fast heart rate. Rate was controlled with diltiazem in the emergency department. Continue to take diltiazem once a day as prescribed. He may need to have additional medication. You could take this medicine twice a day if your doctor instructs you to. Follow-up with Dr. Ronna PolioJennifer Walker. If you have further fast heart rate, if you have shortness of breath or chest pain, or give other urgent concerns, return to the emergency department.   Atrial Fibrillation Atrial fibrillation is a type of heartbeat that is irregular or fast (rapid). If you have this condition, your heart keeps quivering in a weird (chaotic) way. This condition can make it so your heart cannot pump blood normally. Having this condition gives a person more risk for stroke, heart failure, and other heart problems. There are different types of atrial fibrillation. Talk with your doctor to learn about the type that you have. HOME CARE  Take over-the-counter and prescription medicines only as told by your doctor.  If your doctor prescribed a blood-thinning medicine, take it exactly as told. Taking too much of it can cause bleeding. If you do not take enough of it, you will not have the protection that you need against stroke and other problems.  Do not use any tobacco products. These include cigarettes, chewing tobacco, and e-cigarettes. If you need help quitting, ask your doctor.  If you have apnea (obstructive sleep apnea), manage it as told by your doctor.  Do not drink alcohol.  Do not drink beverages that have caffeine. These include coffee, soda, and tea.  Maintain a healthy weight. Do not use diet pills unless your doctor says they are safe for you. Diet pills may make heart problems worse.  Follow diet instructions as told by your doctor.  Exercise regularly as told by your doctor.  Keep all follow-up visits as told by your doctor. This is important. GET HELP  IF:  You notice a change in the speed, rhythm, or strength of your heartbeat.  You are taking a blood-thinning medicine and you notice more bruising.  You get tired more easily when you move or exercise. GET HELP RIGHT AWAY IF:  You have pain in your chest or your belly (abdomen).  You have sweating or weakness.  You feel sick to your stomach (nauseous).  You notice blood in your throw up (vomit), poop (stool), or pee (urine).  You are short of breath.  You suddenly have swollen feet and ankles.  You feel dizzy.  Your suddenly get weak or numb in your face, arms, or legs, especially if it happens on one side of your body.  You have trouble talking, trouble understanding, or both.  Your face or your eyelid droops on one side. These symptoms may be an emergency. Do not wait to see if the symptoms will go away. Get medical help right away. Call your local emergency services (911 in the [place] Do not drive yourself to the hospital.   This information is not intended to replace advice given to you by your health care provider. Make sure you discuss any questions you have with your health care provider.   Document Released: 05/29/2008 Document Revised: 05/11/2015 Document Reviewed: 12/15/2014 Elsevier Interactive Patient Education [employer]2016 Elsevier Inc.

## 2015-08-26 NOTE — Patient Instructions (Signed)
ER eval

## 2015-08-26 NOTE — Assessment & Plan Note (Signed)
Symptoms, exam, EKG c/w afib with RVR. Started 2L oxygen [place] in office. Will send to ER via EMS for evaluation, cardiac markers. May need repeat cardioversion. Daughter is with pt and aware. Follow up after eval.

## 2015-08-26 NOTE — Progress Notes (Signed)
Pre-visit discussion using our clinic review tool. No additional management support is needed unless otherwise documented below in the visit note.  

## 2015-08-30 ENCOUNTER — Telehealth: Payer: Self-pay | Admitting: Internal Medicine

## 2015-08-30 NOTE — Telephone Encounter (Signed)
You are currently booked for that time and date.

## 2015-08-30 NOTE — Telephone Encounter (Signed)
Please call her

## 2015-08-30 NOTE — Telephone Encounter (Signed)
Spoke with patients daughter about mothers symptoms. She stated that her mother is still having foot/ankle swelling and some SOB when she gets up to move around. Spoke with Lyla SonCarrie about symptoms and she thinks that patient should be seen. Scheduled patient with Dr. Adriana Simasook on 08/31/15 at 9 AM

## 2015-08-30 NOTE — Telephone Encounter (Signed)
Please see below request.   Pt was seen in the ED for A-Fib and was started on below listed medication.   Pt is scheduled to see Dr Dan HumphreysWalker next week, 09/07/14.

## 2015-08-30 NOTE — Telephone Encounter (Signed)
Please call and find out if she is just having feet swelling and slight ankle swelling or if she is having shortness of breath. Does the swelling improve with her feet being elevated? If so she will need compression hose and I can write a script for this. If she is having symptoms she will need to be seen in the office. If it is JUST swelling of feet- this is common, we can do hose, and can be discussed with Dr. Dan HumphreysWalker next week. Thanks!

## 2015-08-30 NOTE — Telephone Encounter (Signed)
Please advise 

## 2015-08-30 NOTE — Telephone Encounter (Signed)
Pt went to ER on 12/23. She was given diltiazem (CARDIZEM CD) 120 MG 24 hr capsule. She has  Swelling in her feet and some swelling in ankles. Daughter wants to know if she should continue medication. To please call.  Thanks.

## 2015-08-31 ENCOUNTER — Encounter: Payer: Self-pay | Admitting: Family Medicine

## 2015-08-31 ENCOUNTER — Ambulatory Visit (INDEPENDENT_AMBULATORY_CARE_PROVIDER_SITE_OTHER): Payer: Medicare Other | Admitting: Family Medicine

## 2015-08-31 VITALS — BP 132/94 | HR 77 | Temp 97.5°F | Ht 60.0 in | Wt 99.5 lb

## 2015-08-31 DIAGNOSIS — I4891 Unspecified atrial fibrillation: Secondary | ICD-10-CM | POA: Diagnosis not present

## 2015-08-31 NOTE — Telephone Encounter (Signed)
Can we add at 2:30pm for 

## 2015-08-31 NOTE — Assessment & Plan Note (Signed)
Chronic problem with exacerbation. Patient having intermittent proximal systems of palpitations and shortness of breath.  Noted from cardiology review today. She was previously scheduled for an echo and a stress test and she canceled. At her last visit with them she was not on any rate control medications. Her recent ED visit was also reviewed. In summary, she was treated with IV diltiazem and transition to PO Cardizem 120 mg daily.  rate was controlled prior to discharge and she was encouraged to follow-up closely with her cardiologist as well as her PCP. EKG obtained today. Interpretation: Aflutter @ rate of 103. Lower extremity edema could be from heart failure or a side effect from the diltiazem. Patient is in need of echocardiogram and will also likely need a stress test as she was supposed to have this previously. I spoke with the cardiology office and got her an urgent appointment today for evaluation given the frequency of her palpitations and symptoms.

## 2015-08-31 NOTE — Patient Instructions (Signed)
Her appt is at 230.  Address: [address], [place], [place] [address]  Phone: [phone]  Follow up closely with Dr. Dan HumphreysWalker

## 2015-08-31 NOTE — Progress Notes (Signed)
Subjective:  Patient ID: Elizabeth Horne, female    DOB: 01-08-1926  Age: 81 y.o. MRN: [id number]  CC: SOB, palpitations, LE edema  HPI:  81 year old female with a complicated past medical history including atrial fibrillation presents with the above complaints. Patient is coming by her daughter and son-in-law today.  Patient was recently sent to the ED after a PCP visit where she was found to be in A. fib with RVR. Her rate was controlled and she was started on oral diltiazem and discharged home in stable condition. Since that time she's had intermittent shortness of breath and palpitations. She's also had bilateral pedal edema.  Last night she had an acute episode of palpitations and shortness of breath. EMS was called. Her rate was found to be controlled and she was advised to take her medication to rest. She was not sent to the hospital.  Patient resents today for evaluation. She continues to have intermittent shortness of breath and palpitations. Her shortness of breath is multifactorial secondary to underlying chronic bronchitis/COPD. She currently denies any chest pain or shortness of breath. She does get winded with activity. Patient and family endorses compliance with diltiazem. They have noticed that she has recently had lower extremity edema and are concerned. She states she's been elevating her feet with little improvement. No known exacerbating factors.  Social Hx   Social History   Social History  . Marital Status: Widowed    Spouse Name: N/A  . Number of Children: N/A  . Years of Education: N/A   Social History Main Topics  . Smoking status: Former Smoker    Types: Cigarettes    Quit date: 06/09/1987  . Smokeless tobacco: Never Used  . Alcohol Use: 0.6 oz/week    1 Glasses of wine per week     Comment: daily glass of wine or 2  . Drug Use: No  . Sexual Activity: Not Asked   Other Topics Concern  . None   Social History Narrative   Lives in [place]. Has [employer]  assistance during the day.      Two children.         Review of Systems  Respiratory: Positive for shortness of breath.   Cardiovascular: Positive for palpitations and leg swelling.    Objective:  BP 132/94 mmHg  Pulse 77  Temp(Src) 97.5 F (36.4 C) (Oral)  Ht 5' (1.524 m)  Wt 99 lb 8 oz (45.133 kg)  BMI 19.43 kg/m2  SpO2 91%  BP/Weight 08/31/2015 08/26/2015 08/26/2015  Systolic BP 132 132 124  Diastolic BP 94 79 80  Wt. (Lbs) 99.5 98 98.13  BMI 19.43 16.31 19.16   Physical Exam  Constitutional:  Chronically ill-appearing female in no acute distress.  HENT:  Head: Normocephalic and atraumatic.  Eyes: No scleral icterus.  Cardiovascular: An irregularly irregular rhythm present.  Pulmonary/Chest: Effort normal.  Decreased breath sounds throughout.  Neurological: She is alert.  Psychiatric: She has a normal mood and affect.  Vitals reviewed.  Lab Results  Component Value Date   WBC 7.9 08/26/2015   HGB 14.1 08/26/2015   HCT 43.1 08/26/2015   PLT 176 08/26/2015   GLUCOSE 91 08/26/2015   CHOL 155 06/08/2014   TRIG 49.0 06/08/2014   HDL 76.70 06/08/2014   LDLCALC 69 06/08/2014   ALT 13 06/29/2015   AST 28 06/29/2015   NA 140 08/26/2015   K 4.6 08/26/2015   CL 104 08/26/2015   CREATININE 0.98 08/26/2015   BUN 37*  08/26/2015   CO2 26 08/26/2015   TSH 4.756* 08/26/2015   INR 0.86 02/26/2015    Assessment & Plan:   Problem List Items Addressed This Visit    Atrial fibrillation (HCC) - Primary    Chronic problem with exacerbation. Patient having intermittent proximal systems of palpitations and shortness of breath.  Noted from cardiology review today. She was previously scheduled for an echo and a stress test and she canceled. At her last visit with them she was not on any rate control medications. Her recent ED visit was also reviewed. In summary, she was treated with IV diltiazem and transition to PO Cardizem 120 mg daily.  rate was controlled prior to  discharge and she was encouraged to follow-up closely with her cardiologist as well as her PCP. EKG obtained today. Interpretation: Aflutter @ rate of 103. Lower extremity edema could be from heart failure or a side effect from the diltiazem. Patient is in need of echocardiogram and will also likely need a stress test as she was supposed to have this previously. I spoke with the cardiology office and got her an urgent appointment today for evaluation given the frequency of her palpitations and symptoms.      Relevant Orders   EKG 12-Lead (Completed)     Follow-up: PRN  Everlene Other DO [facility]

## 2015-08-31 NOTE — Progress Notes (Signed)
Pre visit review using our clinic review tool, if applicable. No additional management support is needed unless otherwise documented below in the visit note. 

## 2015-09-01 NOTE — Telephone Encounter (Signed)
Pt was seen by Dr Cook yesterday  

## 2015-09-07 ENCOUNTER — Telehealth: Payer: Self-pay | Admitting: Internal Medicine

## 2015-09-07 NOTE — Telephone Encounter (Signed)
Pt's daughter in-law dropped off questions for Dr. Dan HumphreysWalker to go over before tomorrow's appt with pt. Paper is in Dr. Tilman NeatWalker's box.

## 2015-09-07 NOTE — Telephone Encounter (Signed)
Please advise, for appointment tomorrow.  Thanks

## 2015-09-08 ENCOUNTER — Ambulatory Visit (INDEPENDENT_AMBULATORY_CARE_PROVIDER_SITE_OTHER): Payer: Medicare Other | Admitting: Internal Medicine

## 2015-09-08 ENCOUNTER — Encounter: Payer: Self-pay | Admitting: Internal Medicine

## 2015-09-08 VITALS — BP 150/85 | HR 98 | Temp 98.0°F | Ht 60.0 in | Wt 96.4 lb

## 2015-09-08 DIAGNOSIS — I480 Paroxysmal atrial fibrillation: Secondary | ICD-10-CM

## 2015-09-08 DIAGNOSIS — R0600 Dyspnea, unspecified: Secondary | ICD-10-CM

## 2015-09-08 DIAGNOSIS — M810 Age-related osteoporosis without current pathological fracture: Secondary | ICD-10-CM

## 2015-09-08 DIAGNOSIS — R413 Other amnesia: Secondary | ICD-10-CM

## 2015-09-08 MED ORDER — VITAMIN D3 50 MCG (2000 UT) PO CAPS: 2000.0000 [IU] | ORAL_CAPSULE | Freq: Every day | ORAL | Status: AC

## 2015-09-08 NOTE — Assessment & Plan Note (Signed)
Dyspnea on exertion and mild dyspnea at rest noted by pt. Suspect atrial fibrillation contributing, however rate has been well controlled. Reviewed notes from [facility] and [facility] and I cannot find a recent cardiac ECHO. Will order ECHO. Given her smoking history, will also get pulmonary evaluation with PFTs. Reviewed recent CXR which showed no acute findings.

## 2015-09-08 NOTE — Assessment & Plan Note (Signed)
Patient and family would now like to pursue more formal memory testing. Referral placed for testing with Dr. Letta MoynahanLarter. We also discussed that low thyroid function may be contributing. Reviewed proper way to take Levothyroxine. Will plan repeat TSH in 6 weeks.

## 2015-09-08 NOTE — Progress Notes (Signed)
Pre visit review using our clinic review tool, if applicable. No additional management support is needed unless otherwise documented below in the visit note. 

## 2015-09-08 NOTE — Assessment & Plan Note (Signed)
Rate now controlled on Diltiazem. Reviewed notes from Cardiology. Pt is not on anticoagulation other than aspirin because of concern about falls risk and history of SDH.

## 2015-09-08 NOTE — Patient Instructions (Addendum)
Take Levothyroxine on an empty stomach at least prior to breakfast.  We will set up evaluation with pulmonologist for pulmonary function testing.  We will also set up a cardiac ECHO.   Stop Calcium supplement. Start Vit D 2000units daily. This is available as a small capsule over the counter by [employer].

## 2015-09-08 NOTE — Assessment & Plan Note (Signed)
Unclear based on notes from [facility] how long pt was off Fosamax. Order for repeat bone density testing were placed, however pt reports that she went for the study and they declined to perform it. Will look into this. Encouraged her to take Vit D 2000units daily. Dietary calcium.

## 2015-09-08 NOTE — Progress Notes (Signed)
Subjective:    Patient ID: Elizabeth Horne, female    DOB: February 05, 1926, 82 y.o.   MRN: [id number]  HPI  82YO female presents for follow up with her daughter. They have numerous questions about ongoing health issues listed in typed paperwork (see scanned).  Last seen 12/23 for AFIB with RVR. Sent to ED for management. Had Holter monitor which showed predominant AFIB with HR of 89 beats per min. Per daughter,  Dr. Cassie Freer felt that dyspnea not related to AFIB. Feeling short of breath all the time. Much worse with exertion. They question COPD given she is a former smoker.  Also unclear that she understands medication regimen at home. Daughter-in-law reports multiple missed medications. Started on Cardizem and having some occasional lower leg swelling. Then, started on furosemide for this. Some improvement in leg swelling with Furosemide.  Osteoporosis - History of OP. Previously on Fosamax. Unclear when this medication was stopped. Dr. Aldean Baker notes show Fosamax to be continued in 2015, however pt was not on medication in Fall 2015. Pt reports she went for recent bone density testing and staff refused to complete procedure because of metal hardware?  Family would like to start patient back with care assistance in the home. They report she is unable to prepare foods, bathe, dress without significant dyspnea. They are also concerned about memory loss. Pt is unable to remember recent events, organize medications, and consistently take medications as prescribed.   Wt Readings from Last 3 Encounters:  09/08/15 96 lb 6 oz (43.715 kg)  08/31/15 99 lb 8 oz (45.133 kg)  08/26/15 98 lb (44.453 kg)   BP Readings from Last 3 Encounters:  09/08/15 150/85  08/31/15 132/94  08/26/15 132/79    Past Medical History  Diagnosis Date  . Arthritis   . Hypertension   . Hyperlipidemia   . Thyroid disease   . A-fib [facility])    Family History  Problem Relation Age of Onset  . Lung disease Sister     Past Surgical History  Procedure Laterality Date  . Tonsillectomy and adenoidectomy    . Total hip arthroplasty Bilateral   . Thyroidectomy, partial      benign  . Total shoulder arthroplasty Bilateral   . Kyphoplasty  2015    x 2, Dr. Rosita Kea  . Abdominal hysterectomy      menorrhagia  . Vaginal delivery      2  . Joint replacement    . Craniotomy     Social History   Social History  . Marital Status: Widowed    Spouse Name: N/A  . Number of Children: N/A  . Years of Education: N/A   Social History Main Topics  . Smoking status: Former Smoker    Types: Cigarettes    Quit date: 06/09/1987  . Smokeless tobacco: Never Used  . Alcohol Use: 0.6 oz/week    1 Glasses of wine per week     Comment: daily glass of wine or 2  . Drug Use: No  . Sexual Activity: Not Asked   Other Topics Concern  . None   Social History Narrative   Lives in [place]. Has 10hr assistance during the day.      Two children.          Review of Systems  Constitutional: Negative for fever, chills, appetite change, fatigue and unexpected weight change.  HENT: Negative for congestion, postnasal drip and rhinorrhea.   Eyes: Negative for visual disturbance.  Respiratory: Positive for cough, shortness  of breath (worse with exertion) and wheezing (occasional noted by family). Negative for chest tightness.   Cardiovascular: Positive for palpitations and leg swelling. Negative for chest pain.  Gastrointestinal: Negative for nausea, vomiting, abdominal pain, diarrhea, constipation and abdominal distention.  Musculoskeletal: Negative for myalgias and arthralgias.  Skin: Negative for color change and rash.  Neurological: Positive for weakness. Negative for numbness and headaches.  Hematological: Negative for adenopathy. Does not bruise/bleed easily.  Psychiatric/Behavioral: Positive for confusion and decreased concentration. Negative for behavioral problems, sleep disturbance, dysphoric mood and  agitation. The patient is not nervous/anxious.        Objective:    BP 150/85 mmHg  Pulse 98  Temp(Src) 98 F (36.7 C) (Oral)  Ht 5' (1.524 m)  Wt 96 lb 6 oz (43.715 kg)  BMI 18.82 kg/m2  SpO2 92% Physical Exam  Constitutional: She is oriented to person, place, and time. She appears well-developed and well-nourished. No distress.  HENT:  Head: Normocephalic and atraumatic.  Right Ear: External ear normal.  Left Ear: External ear normal.  Nose: Nose normal.  Mouth/Throat: Oropharynx is clear and moist. No oropharyngeal exudate.  Eyes: Conjunctivae are normal. Pupils are equal, round, and reactive to light. Right eye exhibits no discharge. Left eye exhibits no discharge. No scleral icterus.  Neck: Normal range of motion. Neck supple. No tracheal deviation present. No thyromegaly present.  Cardiovascular: Normal rate, normal heart sounds and intact distal pulses.  An irregularly irregular rhythm present. Exam reveals no gallop and no friction rub.   No murmur heard. Pulmonary/Chest: Effort normal and breath sounds normal. No accessory muscle usage. No tachypnea and no bradypnea. No respiratory distress. She has no decreased breath sounds. She has no wheezes. She has no rhonchi. She has no rales. She exhibits no tenderness.  Musculoskeletal: Normal range of motion. She exhibits no edema or tenderness.  Lymphadenopathy:    She has no cervical adenopathy.  Neurological: She is alert and oriented to person, place, and time. No cranial nerve deficit. She exhibits normal muscle tone. Coordination normal.  Skin: Skin is warm and dry. No rash noted. She is not diaphoretic. No erythema. No pallor.  Psychiatric: She has a normal mood and affect. Her behavior is normal. Judgment and thought content normal.          Assessment & Plan:  Over 50min of which >50% spent in face-to-face contact with patient and her daughter-in-law [facility], recent concerns, and  discussing plan of care.  Problem List Items Addressed This Visit      Unprioritized   Atrial fibrillation (HCC) - Primary    Rate now controlled on Diltiazem. Reviewed notes from Cardiology. Pt is not on anticoagulation other than aspirin because of concern about falls risk and history of SDH.      Relevant Medications   furosemide (LASIX) 20 MG tablet   Dyspnea    Dyspnea on exertion and mild dyspnea at rest noted by pt. Suspect atrial fibrillation contributing, however rate has been well controlled. Reviewed notes from [facility] and [facility] and I cannot find a recent cardiac ECHO. Will order ECHO. Given her smoking history, will also get pulmonary evaluation with PFTs. Reviewed recent CXR which showed no acute findings.      Relevant Orders   Ambulatory referral to Pulmonology   ECHOCARDIOGRAM COMPLETE   Memory loss    Patient and family would now like to pursue more formal memory testing. Referral placed for testing with Dr. Letta MoynahanLarter. We also  discussed that low thyroid function may be contributing. Reviewed proper way to take Levothyroxine. Will plan repeat TSH in 6 weeks.      Relevant Orders   Ambulatory referral to Psychology   Osteoporosis, post-menopausal    Unclear based on notes from [place] how long pt was off Fosamax. Order for repeat bone density testing were placed, however pt reports that she went for the study and they declined to perform it. Will look into this. Encouraged her to take Vit D 2000units daily. Dietary calcium.      Relevant Medications   Cholecalciferol (VITAMIN D3) 2000 units capsule       Return in about 4 weeks (around 10/06/2015) for Recheck.

## 2015-09-12 ENCOUNTER — Other Ambulatory Visit: Payer: Medicare Other

## 2015-09-15 ENCOUNTER — Other Ambulatory Visit: Payer: Self-pay

## 2015-09-15 ENCOUNTER — Telehealth: Payer: Self-pay

## 2015-09-15 ENCOUNTER — Ambulatory Visit (INDEPENDENT_AMBULATORY_CARE_PROVIDER_SITE_OTHER): Payer: Medicare Other

## 2015-09-15 ENCOUNTER — Other Ambulatory Visit: Payer: Self-pay | Admitting: Internal Medicine

## 2015-09-15 DIAGNOSIS — R0600 Dyspnea, unspecified: Secondary | ICD-10-CM

## 2015-09-15 NOTE — Telephone Encounter (Signed)
Patient came for an Echo per Dr. Ronna PolioJennifer Walker to evaluate for shortness of breath, Jillyn HiddenGary (Echo Technician) stated during the 20 minutes of her Echo, her HR was 130's to 200 with a BP of 108/72. The patient's HR upon recheck is 118 to 120 and pulse oximetry reading of 90%.  I spoke with Dr. Darrold JunkerParaschos after reviewing her chart to see that he is her Cardiologist. Dr. Darrold JunkerParaschos stated,  "I just saw her for a follow up on 09/08/2015 from the ER visit that was on 08/26/2015 and I am aware of the shortness of breath." Dr. Darrold JunkerParaschos stated, "I can see her tomorrow at 10:30 am."

## 2015-09-15 NOTE — Telephone Encounter (Signed)
S/w pt who states she "feels fine", HR 118-120. She is ambulating throughout the hallways with little effort. States she is "doing what I enjoy doing,...walking" She is agreeable w/appt with Paraschos 1/13 at 10:30am.

## 2015-09-26 ENCOUNTER — Telehealth: Payer: Self-pay

## 2015-09-26 NOTE — Telephone Encounter (Signed)
Pt called want to know the results of the echo test she had performed, I notified her of Dr. Tilman Neat comment and stated that she needed to followup with cardiology.

## 2015-09-29 ENCOUNTER — Telehealth: Payer: Self-pay | Admitting: Internal Medicine

## 2015-09-29 NOTE — Telephone Encounter (Signed)
Caller name: Elizabeth Horne Relationship to patient: patient Can be reached: [phone]  Reason for call: pt wanted Dr. Dan Humphreys to call to [facility] to okay that she can receive B12 shot there.

## 2015-09-30 ENCOUNTER — Ambulatory Visit: Payer: Medicare Other | Admitting: Internal Medicine

## 2015-10-04 ENCOUNTER — Encounter: Payer: Self-pay | Admitting: *Deleted

## 2015-10-05 ENCOUNTER — Encounter: Payer: Self-pay | Admitting: Pulmonary Disease

## 2015-10-05 ENCOUNTER — Ambulatory Visit (INDEPENDENT_AMBULATORY_CARE_PROVIDER_SITE_OTHER): Payer: Medicare Other | Admitting: Pulmonary Disease

## 2015-10-05 VITALS — BP 132/72 | HR 103 | Ht 61.0 in | Wt 98.0 lb

## 2015-10-05 DIAGNOSIS — I482 Chronic atrial fibrillation, unspecified: Secondary | ICD-10-CM

## 2015-10-05 DIAGNOSIS — I272 Other secondary pulmonary hypertension: Secondary | ICD-10-CM | POA: Diagnosis not present

## 2015-10-05 DIAGNOSIS — R0600 Dyspnea, unspecified: Secondary | ICD-10-CM

## 2015-10-06 ENCOUNTER — Encounter: Payer: Self-pay | Admitting: Pulmonary Disease

## 2015-10-06 NOTE — Progress Notes (Signed)
PULMONARY CONSULT NOTE  Requesting MD/Service: Ronna Polio Date of initial consultation: 10/05/15 Reason for consultation: Dyspnea  PT PROFILE: 82 y.o. F with chronic AF and moderate PAH by echocardiogram. Initially referred for eval of DOE. On initial evaluation, pt felt that her dyspnea had improved and did not wit to pursue further eval  HPI:  Pleasant 82 F with chronic AF who had increased DOE prior to [facility] and notes that she was awakened on 2 nights with SOB. At her baseline, she has class 2 dyspnea and reports that she is able to climb 20 stairs into her apartment usually having to stop and rest once during the ascent. She believes that she ambulates at a slightly slower pace than most people of her age. She indicates that her current symptoms have not changed over the past couple of years. She denies CP and orthopnea and has had no episodes of PND since before Xmas. She denies cough and sputum production. She has never had hemoptysis. She has chronic mild LE edema which is not changed recently. She has undergone recent evaluation by Dr Darrold Junker who reports that her AF is well controlled. A recent ehcocardiogram was performed revealing normal LVEF, markedly dilated left atrium and moderate PAH (RVSP est 54 mmHg)  Past Medical History  Diagnosis Date  . Arthritis   . Hypertension   . Hyperlipidemia   . Thyroid disease   . A-fib [facility])     Past Surgical History  Procedure Laterality Date  . Tonsillectomy and adenoidectomy    . Total hip arthroplasty Bilateral   . Thyroidectomy, partial      benign  . Total shoulder arthroplasty Bilateral   . Kyphoplasty  2015    x 2, Dr. Rosita Kea  . Abdominal hysterectomy      menorrhagia  . Vaginal delivery      2  . Joint replacement    . Craniotomy      MEDICATIONS: I have reviewed all medications and confirmed regimen as documented  Social History   Social History  . Marital Status: Widowed    Spouse Name: N/A  . Number of  Children: N/A  . Years of Education: N/A   Occupational History  . Not on file.   Social History Main Topics  . Smoking status: Former Smoker    Types: Cigarettes    Quit date: 06/09/1987  . Smokeless tobacco: Never Used  . Alcohol Use: 0.6 oz/week    1 Glasses of wine per week     Comment: daily glass of wine or 2  . Drug Use: No  . Sexual Activity: Not on file   Other Topics Concern  . Not on file   Social History Narrative   Lives in [place]. Has 10hr assistance during the day.      Two children.        Former Corporate treasurer  Family History  Problem Relation Age of Onset  . Lung disease Sister     ROS: No fever, myalgias/arthralgias, unexplained weight loss or weight gain No new focal weakness or sensory deficits No otalgia, hearing loss, visual changes, nasal and sinus symptoms, mouth and throat problems No neck pain or adenopathy No abdominal pain, N/V/D, diarrhea, change in bowel pattern No dysuria, change in urinary pattern No calf tenderness   Filed Vitals:   10/05/15 1435  BP: 132/72  Pulse: 103  Height:  (1.549 m)  Weight: 98 lb (44.453 kg)  SpO2: 94%     EXAM:  Gen: Pleasant, engaging, WDWN, No overt respiratory distress HEENT: NCAT, oropharynx normal Neck: Supple without LAN, thyromegaly, JVD Lungs: breath sounds slightly diminished, percussion note normal, faint bibasilar crackles, no wheezes Cardiovascular: IRIR, rate controlled, no M noted Abdomen: Soft, nontender, normal BS Ext: without clubbing, cyanosis. 1+ symmetric pretibial edema Neuro: CNs grossly intact, motor and sensory intact, DTRs symmetric Skin: Limited exam, no lesions noted  DATA:   BMP Latest Ref Rng 08/26/2015 07/11/2015 07/06/2015  Glucose 65 - 99 mg/dL 91 78 97  BUN 6 - 20 mg/dL 40(J) 81(X) 91(Y)  Creatinine 0.44 - 1.00 mg/dL 7.82 9.56 2.13(Y)  Sodium 135 - 145 mmol/L 140 141 141  Potassium 3.5 - 5.1 mmol/L 4.6 4.6 5.1  Chloride 101 - 111 mmol/L 104  102 103  CO2 22 - 32 mmol/L Calcium 8.9 - 10.3 mg/dL 9.7 9.9 9.5    CBC Latest Ref Rng 08/26/2015 06/29/2015 02/26/2015  WBC 3.6 - 11.0 K/uL 7.9 8.8 9.4  Hemoglobin 12.0 - 16.0 g/dL 86.5 78.4 69.6  Hematocrit 35.0 - 47.0 % 43.1 40.5 40.4  Platelets 150 - 440 K/uL 176 208.0 197    CXR:  No overt edema or infiltrates  IMPRESSION:     ICD-9-CM ICD-10-CM   1. Dyspnea 786.09 R06.00   2. Pulmonary hypertension (HCC) 416.8 I27.2   3. Chronic a-fib (HCC) 427.31 I48.2   Chronic dyspnea - she seems very well compensated, in fact, her exercise tolerance is probably above average for a woman of her age. Her dyspnea is likely on the basis of chronic AF and pulmonary hypertension. The echocardiogram reveals preserved LV function, but I note enlarged left atrium suggesting elevated LVEDP which suggests a component of diastolic dysfunction. She has an incidental finding of pulmonary hypertension on echocardiogram which likely falls into group 2 PAH **. I doubt there is much pulmonary disease as her smoking history is relatively modest and her CXR does not indicate lung disease. There is likely little to be gained from embarking on a complicated pulmonary evaluation and the patient expressed a desire to avoid such. She is satisfied with her current functional status.    PLAN:  No further evaluation indicated @ this time.  Continue current management of CAF.  I did not discuss Advanced Directives with her but suggest that this be considered if not done already Follow up as needed   Merwyn Katos, MD [facility]  ** Group 1 PAH Group 2 PH due to left heart disease Group 3 PH due to chronic lung disease Group 4 Chronic thromboembolic pulmonary hypertension  Group 5 PH due to unclear multifactorial mechanisms

## 2015-10-07 ENCOUNTER — Encounter: Payer: Self-pay | Admitting: *Deleted

## 2015-10-13 ENCOUNTER — Ambulatory Visit: Payer: Medicare Other | Admitting: Internal Medicine

## 2015-10-19 ENCOUNTER — Encounter: Payer: Self-pay | Admitting: Internal Medicine

## 2015-10-19 ENCOUNTER — Ambulatory Visit (INDEPENDENT_AMBULATORY_CARE_PROVIDER_SITE_OTHER): Payer: Medicare Other | Admitting: Internal Medicine

## 2015-10-19 VITALS — BP 102/68 | HR 87 | Temp 97.9°F | Ht 60.0 in | Wt 94.5 lb

## 2015-10-19 DIAGNOSIS — R0600 Dyspnea, unspecified: Secondary | ICD-10-CM | POA: Diagnosis not present

## 2015-10-19 DIAGNOSIS — M810 Age-related osteoporosis without current pathological fracture: Secondary | ICD-10-CM | POA: Diagnosis not present

## 2015-10-19 DIAGNOSIS — R64 Cachexia: Secondary | ICD-10-CM | POA: Diagnosis not present

## 2015-10-19 DIAGNOSIS — F0390 Unspecified dementia without behavioral disturbance: Secondary | ICD-10-CM

## 2015-10-19 DIAGNOSIS — K625 Hemorrhage of anus and rectum: Secondary | ICD-10-CM | POA: Diagnosis not present

## 2015-10-19 DIAGNOSIS — L821 Other seborrheic keratosis: Secondary | ICD-10-CM | POA: Insufficient documentation

## 2015-10-19 LAB — COMPREHENSIVE METABOLIC PANEL
ALK PHOS: 78 U/L (ref 39–117)
ALT: 12 U/L (ref 0–35)
AST: 25 U/L (ref 0–37)
Albumin: 5.1 g/dL (ref 3.5–5.2)
BILIRUBIN TOTAL: 0.7 mg/dL (ref 0.2–1.2)
BUN: 50 mg/dL — ABNORMAL HIGH (ref 6–23)
CALCIUM: 10.1 mg/dL (ref 8.4–10.5)
CO2: 32 meq/L (ref 19–32)
Chloride: 97 mEq/L (ref 96–112)
Creatinine, Ser: 1.43 mg/dL — ABNORMAL HIGH (ref 0.40–1.20)
GFR: 36.87 mL/min — AB (ref >60.00)
Glucose, Bld: 142 mg/dL — ABNORMAL HIGH (ref 70–99)
Potassium: 3.8 mEq/L (ref 3.5–5.1)
Sodium: 141 mEq/L (ref 135–145)
TOTAL PROTEIN: 7.8 g/dL (ref 6.0–8.3)

## 2015-10-19 LAB — CBC
HCT: 44.2 % (ref 36.0–46.0)
HEMOGLOBIN: 14.6 g/dL (ref 12.0–15.0)
MCHC: 32.9 g/dL (ref 30.0–36.0)
MCV: 93 fl (ref 78.0–100.0)
PLATELETS: 204 10*3/uL (ref 150.0–400.0)
RBC: 4.75 Mil/uL (ref 3.87–5.11)
RDW: 14.4 % (ref 11.5–15.5)
WBC: 10.5 10*3/uL (ref 4.0–10.5)

## 2015-10-19 LAB — VITAMIN D 25 HYDROXY (VIT D DEFICIENCY, FRACTURES): VITD: 61.73 ng/mL (ref 30.00–100.00)

## 2015-10-19 MED ORDER — DILTIAZEM HCL ER COATED BEADS 240 MG PO CP24: 240.0000 mg | ORAL_CAPSULE | Freq: Once | ORAL | Status: DC

## 2015-10-19 NOTE — Assessment & Plan Note (Signed)
Skin lesions on breast and chest wall c/w SK. Discussed benign nature of these lesions. Will monitor for now.

## 2015-10-19 NOTE — Progress Notes (Signed)
Subjective:    Patient ID: Elizabeth Horne, female    DOB: 1925/10/13, 82 y.o.   MRN: [id number]  HPI  82YO female presents for follow up.  Dyspnea - Symptoms relatively stable with dyspnea on exertion such as walking 50yards. ECHO was normal except for mild elevation of LVEDP. Noted to have elevated HR during ECHO, seen by cardiology, dose of Diltiazem was increased to  daily. Tolerating well.   Osteoporosis - Previously took Fosamax. Unsure when stopped. Bone density testing from 07/2015 showed osteoporosis with T-score -3.2 which was fairly stable. Vit D supplement in place. Questions other options for treatment.  Weight loss - Family concerned about continued weight loss. Pt notes poor appetite. Eats small meals. Family trying to supplement with high protein and high calorie snacks. Planning to get assistance in the home to help with preparing meals.  Dementia - Recent cognitive testing confirmed moderate dementia. Patient acknowledges that she needs assistance in completing tasks in the home. Not interested in medications to help slow memory loss because of potential side effects.  Rectal bleeding - Pt reports a couple of recent episodes of black or dark maroon streaking of her stool. No tarry stool. Some stool is normal and brown in color. No blood on tissue with wiping. No rectal or abdominal pain.  Breast lesion - Pt notes skin lesions on right lower breast. Unsure how long present. Not painful. Unsure if changing.   Wt Readings from Last 3 Encounters:  10/19/15 94 lb 8 oz (42.865 kg)  10/05/15 98 lb (44.453 kg)  09/08/15 96 lb 6 oz (43.715 kg)   BP Readings from Last 3 Encounters:  10/19/15 102/68  10/05/15 132/72  09/08/15 150/85    Past Medical History  Diagnosis Date  . Arthritis   . Hypertension   . Hyperlipidemia   . Thyroid disease   . A-fib [facility])    Family History  Problem Relation Age of Onset  . Lung disease Sister    Past Surgical History    Procedure Laterality Date  . Tonsillectomy and adenoidectomy    . Total hip arthroplasty Bilateral   . Thyroidectomy, partial      benign  . Total shoulder arthroplasty Bilateral   . Kyphoplasty  2015    x 2, Dr. Rosita Kea  . Abdominal hysterectomy      menorrhagia  . Vaginal delivery      2  . Joint replacement    . Craniotomy     Social History   Social History  . Marital Status: Widowed    Spouse Name: N/A  . Number of Children: N/A  . Years of Education: N/A   Social History Main Topics  . Smoking status: Former Smoker    Types: Cigarettes    Quit date: 06/09/1987  . Smokeless tobacco: Never Used  . Alcohol Use: 0.6 oz/week    1 Glasses of wine per week     Comment: daily glass of wine or 2  . Drug Use: No  . Sexual Activity: Not Asked   Other Topics Concern  . None   Social History Narrative   Lives in [place]. Has 10hr assistance during the day.      Two children.          Review of Systems  Constitutional: Positive for appetite change. Negative for fever, chills, fatigue and unexpected weight change.  Eyes: Negative for visual disturbance.  Respiratory: Positive for shortness of breath. Negative for cough and chest tightness.  Cardiovascular: Negative for chest pain, palpitations and leg swelling.  Gastrointestinal: Positive for blood in stool. Negative for nausea, vomiting, abdominal pain, diarrhea, constipation, abdominal distention and rectal pain.  Musculoskeletal: Negative for myalgias and arthralgias.  Skin: Negative for color change and rash.  Hematological: Negative for adenopathy. Does not bruise/bleed easily.  Psychiatric/Behavioral: Positive for confusion and decreased concentration. Negative for sleep disturbance and dysphoric mood. The patient is not nervous/anxious.        Objective:    BP 102/68 mmHg  Pulse 87  Temp(Src) 97.9 F (36.6 C) (Oral)  Ht 5' (1.524 m)  Wt 94 lb 8 oz (42.865 kg)  BMI 18.46 kg/m2  SpO2 92% Physical  Exam  Constitutional: She is oriented to person, place, and time. She appears well-developed and well-nourished. No distress.  HENT:  Head: Normocephalic and atraumatic.  Right Ear: External ear normal.  Left Ear: External ear normal.  Nose: Nose normal.  Mouth/Throat: Oropharynx is clear and moist. No oropharyngeal exudate.  Eyes: Conjunctivae and EOM are normal. Pupils are equal, round, and reactive to light. Right eye exhibits no discharge.  Neck: Normal range of motion. Neck supple. No thyromegaly present.  Cardiovascular: Normal rate, regular rhythm, normal heart sounds and intact distal pulses.  Exam reveals no gallop and no friction rub.   No murmur heard. Pulmonary/Chest: Effort normal. No accessory muscle usage. No respiratory distress. She has no decreased breath sounds. She has no wheezes. She has no rhonchi. She has no rales.    Abdominal: Soft. Bowel sounds are normal. She exhibits no distension and no mass. There is no tenderness. There is no rebound and no guarding.  Genitourinary: Rectal exam shows external hemorrhoid (midline posterior, non-thrombosed). Rectal exam shows no fissure, no mass, no tenderness and anal tone normal. Guaiac negative stool.  Musculoskeletal: Normal range of motion. She exhibits no edema or tenderness.  Lymphadenopathy:    She has no cervical adenopathy.  Neurological: She is alert and oriented to person, place, and time. No cranial nerve deficit. Coordination normal.  Skin: Skin is warm and dry. No rash noted. She is not diaphoretic. No erythema. No pallor.  Psychiatric: She has a normal mood and affect. Her speech is normal and behavior is normal. Judgment and thought content normal. Cognition and memory are impaired.          Assessment & Plan:  Over of which >50% spent in face-to-face contact with patient, her daughter, and her daughter in law discussing plan of care for multiple medical issues.  Problem List Items Addressed This  Visit      Unprioritized   Cachexia (HCC)    Wt Readings from Last 3 Encounters:  10/19/15 94 lb 8 oz (42.865 kg)  10/05/15 98 lb (44.453 kg)  09/08/15 96 lb 6 oz (43.715 kg)   Continued weight loss. Poor appetite and po intake. Reviewed some high calorie/high protein snacks. Encouraged her to consider nutrition evaluation. Family is in process of setting up home health services with assistance with meal prep. Will follow up in 4 weeks.      Dementia    Reviewed notes from Dr. Letta Moynahan and Cognitive evaluation c/w moderate dementia. Discussed potential risks of medications to slow progression of dementia. Will set up home health assistance.      Dyspnea - Primary    Symptoms stable. Reviewed ECHO which was normal except for mild elevation of LVEDP. Reviewed pulmonary evaluation. Plan to monitor for now.       Osteoporosis,  post-menopausal    Vit D level normal today. Reviewed bone density testing with patient and her family. T-score -3.2 Discussed risks of bisphosphonates given her CKD. Will look into coverage for Prolia.      Relevant Orders   Vitamin D (25 hydroxy) (Completed)   Rectal bleeding    Recent episodes of blood in stool. Stool guaic neg today. Small non-thrombosed hemorrhoid noted. Will set up GI evaluation for possible flex sig. CBC today normal. She will seek care soon if recurrent episodes of bloody or black stool.      Relevant Orders   CBC (Completed)   Comprehensive metabolic panel (Completed)   Ambulatory referral to Gastroenterology   Seborrheic keratoses    Skin lesions on breast and chest wall c/w SK. Discussed benign nature of these lesions. Will monitor for now.          Return in about 4 weeks (around 11/16/2015) for Recheck.

## 2015-10-19 NOTE — Assessment & Plan Note (Signed)
Wt Readings from Last 3 Encounters:  10/19/15 94 lb 8 oz (42.865 kg)  10/05/15 98 lb (44.453 kg)  09/08/15 96 lb 6 oz (43.715 kg)   Continued weight loss. Poor appetite and po intake. Reviewed some high calorie/high protein snacks. Encouraged her to consider nutrition evaluation. Family is in process of setting up home health services with assistance with meal prep. Will follow up in 4 weeks.

## 2015-10-19 NOTE — Assessment & Plan Note (Signed)
Vit D level normal today. Reviewed bone density testing with patient and her family. T-score -3.2 Discussed risks of bisphosphonates given her CKD. Will look into coverage for Prolia.

## 2015-10-19 NOTE — Progress Notes (Signed)
Pre visit review using our clinic review tool, if applicable. No additional management support is needed unless otherwise documented below in the visit note. 

## 2015-10-19 NOTE — Assessment & Plan Note (Signed)
Symptoms stable. Reviewed ECHO which was normal except for mild elevation of LVEDP. Reviewed pulmonary evaluation. Plan to monitor for now.

## 2015-10-19 NOTE — Assessment & Plan Note (Signed)
Recent episodes of blood in stool. Stool guaic neg today. Small non-thrombosed hemorrhoid noted. Will set up GI evaluation for possible flex sig. CBC today normal. She will seek care soon if recurrent episodes of bloody or black stool.

## 2015-10-19 NOTE — Patient Instructions (Addendum)
We will look into coverage for Prolia to help with osteoporosis.  Labs today. We will set up evaluation with GI to determine cause of rectal bleeding.  Follow up in 4 weeks.   High-Protein and High-Calorie Diet Eating high-protein and high-calorie foods can help you to gain weight, heal after an injury, and recover after an illness or surgery.  WHAT IS MY PLAN? The specific amount of daily protein and calories you need depends on:  Your body weight.  The reason this diet is recommended for you. Generally, a high-protein, high-calorie diet involves:   Eating 250-500 extra calories each day.  Making sure that 10-35% of your daily calories come from protein. Talk to your health care provider about how much protein and how many calories you need each day. Follow the diet as directed by your health care provider.  WHAT DO I NEED TO KNOW ABOUT THIS DIET?  Ask your health care provider if you should take a nutritional supplement.   Try to eat six small meals each day instead of three large meals.   Eat a balanced diet, including one food that is high in protein at each meal.  Keep nutritious snacks handy, such as nuts, trail mixes, dried fruit, and yogurt.   If you have kidney disease or diabetes, eating too much protein may put extra stress on your kidneys. Talk to your health care provider if you have either of those conditions. WHAT ARE SOME HIGH-PROTEIN FOODS? Grains Quinoa. Bulgur wheat. Vegetables Soybeans. Peas. Meats and Other Protein Sources Beef, pork, and poultry. Fish and seafood. Eggs. Tofu. Textured vegetable protein (TVP). Peanut butter. Nuts and seeds. Dried beans. Protein powders. Dairy Whole milk. Whole-milk yogurt. Powdered milk. Cheese. [employer]. Eggnog. Beverages High-protein supplement drinks. Soy milk. Other Protein bars. The items listed above may not be a complete list of recommended foods or beverages. Contact your dietitian for more  options. WHAT ARE SOME HIGH-CALORIE FOODS? Grains Pasta. Quick breads. Muffins. Pancakes. Ready-to-eat cereal. Vegetables Vegetables cooked in oil or butter. Fried potatoes. Fruits Dried fruit. Fruit leather. Canned fruit in syrup. Fruit juice. Avocados. Meats and Other Protein Sources Peanut butter. Nuts and seeds. Dairy Heavy cream. Whipped cream. Cream cheese. Sour cream. Ice cream. Custard. Pudding. Beverages Meal-replacement beverages. Nutrition shakes. Fruit juice. Sugar-sweetened soft drinks. Condiments Salad dressing. Mayonnaise. Alfredo sauce. Fruit preserves or jelly. Honey. Syrup. Sweets/Desserts Cake. Cookies. Pie. Pastries. Candy bars. Chocolate. Fats and Oils Butter or margarine. Oil. Gravy. Other Meal-replacement bars. The items listed above may not be a complete list of recommended foods or beverages. Contact your dietitian for more options. WHAT ARE SOME TIPS FOR INCLUDING HIGH-PROTEIN AND HIGH-CALORIE FOODS IN MY DIET?  Add whole milk, half-and-half, or heavy cream to cereal, pudding, soup, or hot cocoa.  Add whole milk to instant breakfast drinks.  Add peanut butter to oatmeal or smoothies.  Add powdered milk to baked goods, smoothies, or milkshakes.  Add powdered milk, cream, or butter to mashed potatoes.  Add cheese to cooked vegetables.  Make whole-milk yogurt parfaits. Top them with granola, fruit, or nuts.  Add cottage cheese to your fruit.  Add avocados, cheese, or both to sandwiches or salads.  Add meat, poultry, or seafood to rice, pasta, casseroles, salads, and soups.   Use mayonnaise when making egg salad, chicken salad, or tuna salad.  Use peanut butter as a topping for pretzels, celery, or crackers.  Add beans to casseroles, dips, and spreads.  Add pureed beans to sauces and soups.  Replace calorie-free drinks with calorie-containing drinks, such as milk and fruit juice.   This information is not intended to replace  advice given to you by your health care provider. Make sure you discuss any questions you have with your health care provider.   Document Released: 08/20/2005 Document Revised: 09/10/2014 Document Reviewed: 02/02/2014 Elsevier Interactive Patient Education [employer]

## 2015-10-19 NOTE — Assessment & Plan Note (Signed)
Reviewed notes from Dr. Letta Moynahan and Cognitive evaluation c/w moderate dementia. Discussed potential risks of medications to slow progression of dementia. Will set up home health assistance.

## 2015-10-26 ENCOUNTER — Telehealth: Payer: Self-pay

## 2015-10-26 NOTE — Telephone Encounter (Signed)
Pt's daughter and law is calling about memory loss evaluation. Please advise, thanks

## 2015-10-27 ENCOUNTER — Other Ambulatory Visit: Payer: Medicare Other

## 2015-10-27 ENCOUNTER — Telehealth: Payer: Self-pay | Admitting: *Deleted

## 2015-10-27 DIAGNOSIS — Z139 Encounter for screening, unspecified: Secondary | ICD-10-CM

## 2015-10-27 NOTE — Telephone Encounter (Signed)
Orders placed for appt tomorrow. Based on appt note

## 2015-10-28 ENCOUNTER — Other Ambulatory Visit: Payer: Medicare Other

## 2015-10-28 NOTE — Telephone Encounter (Signed)
Erskine Squibb left a message stating that the patient Elizabeth Horne had an appointment a week and a half go with Dr. Dan Humphreys, at the visit she was unable to obtain the assessment results of the test that Dr. Letta Moynahan had performed on the patient.  She was calling back to get those results to submit them to the patient's long term care agency.

## 2015-10-28 NOTE — Telephone Encounter (Signed)
I see that the results are in the media tab (1/17 date), have you reviewed? Can these be given to the family to submit?

## 2015-10-28 NOTE — Telephone Encounter (Signed)
We have tried multiple times to print these for the family with no success. You might be able to get them to print or you may have to get Dr. Letta Moynahan to re-submit the forms

## 2015-10-28 NOTE — Telephone Encounter (Signed)
Spoke with the family and they will come on Monday to pick up the forms.  They are up front.

## 2015-10-31 ENCOUNTER — Other Ambulatory Visit: Payer: Self-pay | Admitting: Physician Assistant

## 2015-10-31 DIAGNOSIS — R1032 Left lower quadrant pain: Secondary | ICD-10-CM

## 2015-10-31 DIAGNOSIS — R634 Abnormal weight loss: Secondary | ICD-10-CM

## 2015-10-31 DIAGNOSIS — K921 Melena: Secondary | ICD-10-CM

## 2015-10-31 DIAGNOSIS — R1031 Right lower quadrant pain: Secondary | ICD-10-CM

## 2015-11-07 ENCOUNTER — Ambulatory Visit
Admission: RE | Admit: 2015-11-07 | Discharge: 2015-11-07 | Disposition: A | Discharge status: Home / Self Care | Payer: Medicare Other | Source: Ambulatory Visit | Attending: Physician Assistant | Admitting: Physician Assistant

## 2015-11-07 DIAGNOSIS — R634 Abnormal weight loss: Secondary | ICD-10-CM | POA: Diagnosis present

## 2015-11-07 DIAGNOSIS — K921 Melena: Secondary | ICD-10-CM

## 2015-11-07 DIAGNOSIS — R1030 Lower abdominal pain, unspecified: Secondary | ICD-10-CM | POA: Diagnosis present

## 2015-11-07 DIAGNOSIS — R1032 Left lower quadrant pain: Secondary | ICD-10-CM

## 2015-11-07 DIAGNOSIS — R1031 Right lower quadrant pain: Secondary | ICD-10-CM

## 2015-11-07 MED ORDER — IOHEXOL 300 MG/ML  SOLN
75.0000 mL | Freq: Once | INTRAMUSCULAR | Status: AC | PRN
  Administered 2015-11-07: 75 mL via INTRAVENOUS

## 2015-11-09 ENCOUNTER — Ambulatory Visit: Payer: Medicare Other | Admitting: Internal Medicine

## 2015-11-16 ENCOUNTER — Encounter: Payer: Self-pay | Admitting: Internal Medicine

## 2015-11-16 ENCOUNTER — Ambulatory Visit (INDEPENDENT_AMBULATORY_CARE_PROVIDER_SITE_OTHER): Payer: Medicare Other | Admitting: Internal Medicine

## 2015-11-16 VITALS — BP 122/74 | HR 116 | Temp 97.5°F | Wt 94.0 lb

## 2015-11-16 DIAGNOSIS — R64 Cachexia: Secondary | ICD-10-CM

## 2015-11-16 DIAGNOSIS — K625 Hemorrhage of anus and rectum: Secondary | ICD-10-CM

## 2015-11-16 DIAGNOSIS — Z139 Encounter for screening, unspecified: Secondary | ICD-10-CM | POA: Diagnosis not present

## 2015-11-16 DIAGNOSIS — I480 Paroxysmal atrial fibrillation: Secondary | ICD-10-CM

## 2015-11-16 LAB — BASIC METABOLIC PANEL
BUN: 42 mg/dL — AB (ref 6–23)
CHLORIDE: 101 meq/L (ref 96–112)
CO2: 33 mEq/L — ABNORMAL HIGH (ref 19–32)
Calcium: 9.7 mg/dL (ref 8.4–10.5)
Creatinine, Ser: 1.28 mg/dL — ABNORMAL HIGH (ref 0.40–1.20)
GFR: 41.89 mL/min — AB (ref >60.00)
GLUCOSE: 133 mg/dL — AB (ref 70–99)
Potassium: 4.4 mEq/L (ref 3.5–5.1)
SODIUM: 142 meq/L (ref 135–145)

## 2015-11-16 LAB — TSH: TSH: 26.98 u[IU]/mL — AB (ref 0.35–4.50)

## 2015-11-16 NOTE — Patient Instructions (Addendum)
Labs today to check kidney function.  Follow up in 3 months.

## 2015-11-16 NOTE — Progress Notes (Signed)
Pre visit review using our clinic review tool, if applicable. No additional management support is needed unless otherwise documented below in the visit note. 

## 2015-11-16 NOTE — Assessment & Plan Note (Addendum)
Recent CT abdomen showed no findings to explain rectal bleeding and weight loss. No further bleeding noted by pt. Will continue to monitor.

## 2015-11-16 NOTE — Assessment & Plan Note (Signed)
Rate continued with Diltiazem. No anticoagulation because of risk of falls and h/o SDH. [facility]. No changes today.

## 2015-11-16 NOTE — Progress Notes (Signed)
Subjective:    Patient ID: Elizabeth Horne, female    DOB: 06-22-1926, 82 y.o.   MRN: [id number]  HPI  82YO female presents for follow up.  Several issues were addressed at her recent visit 10/19/2015: Dyspnea - Symptoms relatively stable with dyspnea on exertion such as walking 50yards. ECHO was normal except for mild elevation of LVEDP. Noted to have elevated HR during ECHO, seen by cardiology, dose of Diltiazem was increased to  daily. Tolerating well.   Osteoporosis - Previously took Fosamax. Unsure when stopped. Bone density testing from 07/2015 showed osteoporosis with T-score -3.2 which was fairly stable. Vit D supplement in place. Questions other options for treatment.  Weight loss - Family concerned about continued weight loss. Pt notes poor appetite. Eats small meals. Family trying to supplement with high protein and high calorie snacks. Planning to get assistance in the home to help with preparing meals.  Dementia - Recent cognitive testing confirmed moderate dementia. Patient acknowledges that she needs assistance in completing tasks in the home. Not interested in medications to help slow memory loss because of potential side effects.  Rectal bleeding - Pt reports a couple of recent episodes of black or dark maroon streaking of her stool. No tarry stool. Some stool is normal and brown in color. No blood on tissue with wiping. No rectal or abdominal pain.    In the interim, since her last visit, she underwent evaluation including: CT abdomen - showed no acute abnormalities to explain rectal bleeding or weight loss No further rectal bleeding.  Feeling kind of tired, but generally okay. She notes some chronic shortness of breath with exertion. No recent palpitations or chest pain. No new concerns.  Appetite is about the same Unsure about caloric intake.  She continues to live alone, however has assistance during the daytime at home.   Wt Readings from Last 3  Encounters:  11/16/15 94 lb (42.638 kg)  10/19/15 94 lb 8 oz (42.865 kg)  10/05/15 98 lb (44.453 kg)   BP Readings from Last 3 Encounters:  11/16/15 122/74  10/19/15 102/68  10/05/15 132/72    Past Medical History  Diagnosis Date  . Arthritis   . Hypertension   . Hyperlipidemia   . Thyroid disease   . A-fib [facility])    Family History  Problem Relation Age of Onset  . Lung disease Sister    Past Surgical History  Procedure Laterality Date  . Tonsillectomy and adenoidectomy    . Total hip arthroplasty Bilateral   . Thyroidectomy, partial      benign  . Total shoulder arthroplasty Bilateral   . Kyphoplasty  2015    x 2, Dr. Rosita Kea  . Abdominal hysterectomy      menorrhagia  . Vaginal delivery      2  . Joint replacement    . Craniotomy     Social History   Social History  . Marital Status: Widowed    Spouse Name: N/A  . Number of Children: N/A  . Years of Education: N/A   Social History Main Topics  . Smoking status: Former Smoker    Types: Cigarettes    Quit date: 06/09/1987  . Smokeless tobacco: Never Used  . Alcohol Use: 0.6 oz/week    1 Glasses of wine per week     Comment: daily glass of wine or 2  . Drug Use: No  . Sexual Activity: Not Asked   Other Topics Concern  . None   Social  History Narrative   [place] Has 10hr assistance during the day.      Two children.          Review of Systems  Constitutional: Positive for fatigue. Negative for fever, chills, appetite change and unexpected weight change.  Eyes: Negative for visual disturbance.  Respiratory: Positive for shortness of breath.   Cardiovascular: Negative for chest pain, palpitations and leg swelling.  Gastrointestinal: Negative for nausea, vomiting, abdominal pain, diarrhea, constipation, blood in stool and anal bleeding.  Skin: Negative for color change and rash.  Hematological: Negative for adenopathy. Does not bruise/bleed easily.  Psychiatric/Behavioral: Negative for  dysphoric mood. The patient is not nervous/anxious.        Objective:    BP 122/74 mmHg  Pulse 116  Temp(Src) 97.5 F (36.4 C) (Oral)  Wt 94 lb (42.638 kg)  SpO2 98% Physical Exam  Constitutional: She is oriented to person, place, and time. She appears well-developed and well-nourished. No distress.  HENT:  Head: Normocephalic and atraumatic.  Right Ear: External ear normal.  Left Ear: External ear normal.  Nose: Nose normal.  Mouth/Throat: Oropharynx is clear and moist. No oropharyngeal exudate.  Eyes: Conjunctivae are normal. Pupils are equal, round, and reactive to light. Right eye exhibits no discharge. Left eye exhibits no discharge. No scleral icterus.  Neck: Normal range of motion. Neck supple. No tracheal deviation present. No thyromegaly present.  Cardiovascular: Normal rate, normal heart sounds and intact distal pulses.  An irregularly irregular rhythm present. Exam reveals no gallop and no friction rub.   No murmur heard. Pulmonary/Chest: Effort normal and breath sounds normal. No respiratory distress. She has no wheezes. She has no rales. She exhibits no tenderness.  Musculoskeletal: Normal range of motion. She exhibits no edema or tenderness.  Lymphadenopathy:    She has no cervical adenopathy.  Neurological: She is alert and oriented to person, place, and time. No cranial nerve deficit. She exhibits normal muscle tone. Coordination normal.  Skin: Skin is warm and dry. No rash noted. She is not diaphoretic. No erythema. No pallor.  Psychiatric: She has a normal mood and affect. Her behavior is normal. Judgment and thought content normal.          Assessment & Plan:   Problem List Items Addressed This Visit      Unprioritized   Atrial fibrillation (HCC)    Rate continued with Diltiazem. No anticoagulation because of risk of falls and h/o SDH. [facility]. No changes today.      Relevant Orders   TSH   Cachexia (HCC) - Primary    Wt  Readings from Last 3 Encounters:  11/16/15 94 lb (42.638 kg)  10/19/15 94 lb 8 oz (42.865 kg)  10/05/15 98 lb (44.453 kg)   Encouraged her to continue increased calorie diet.      Relevant Orders   TSH   Rectal bleeding    Recent CT abdomen showed no findings to explain rectal bleeding and weight loss. No further bleeding noted by pt. Will continue to monitor.      Relevant Orders   TSH    Other Visit Diagnoses    Screening        Relevant Orders    TSH        Return in about 3 months (around 02/16/2016) for Recheck.  Ronna PolioJennifer Berania Peedin, MD Internal Medicine [facility]

## 2015-11-16 NOTE — Assessment & Plan Note (Addendum)
Wt Readings from Last 3 Encounters:  11/16/15 94 lb (42.638 kg)  10/19/15 94 lb 8 oz (42.865 kg)  10/05/15 98 lb (44.453 kg)   Encouraged her to continue increased calorie diet.

## 2015-11-18 ENCOUNTER — Encounter: Payer: Self-pay | Admitting: *Deleted

## 2015-11-24 ENCOUNTER — Other Ambulatory Visit: Payer: Self-pay | Admitting: Internal Medicine

## 2015-12-15 ENCOUNTER — Other Ambulatory Visit: Payer: Medicare Other

## 2015-12-19 ENCOUNTER — Telehealth: Payer: Self-pay | Admitting: *Deleted

## 2015-12-19 ENCOUNTER — Other Ambulatory Visit (INDEPENDENT_AMBULATORY_CARE_PROVIDER_SITE_OTHER): Payer: Medicare Other

## 2015-12-19 DIAGNOSIS — E038 Other specified hypothyroidism: Secondary | ICD-10-CM | POA: Diagnosis not present

## 2015-12-19 LAB — TSH: TSH: 12.36 u[IU]/mL — ABNORMAL HIGH (ref 0.35–4.50)

## 2015-12-19 LAB — T3, FREE: T3, Free: 2.2 pg/mL — ABNORMAL LOW (ref 2.3–4.2)

## 2015-12-19 LAB — T4, FREE: Free T4: 1.14 ng/dL (ref 0.60–1.60)

## 2015-12-19 NOTE — Telephone Encounter (Signed)
Is it just for a tsh? And if so what dx?

## 2015-12-19 NOTE — Telephone Encounter (Signed)
TSH.  Hypothyroidism

## 2015-12-19 NOTE — Telephone Encounter (Signed)
Labs and dx?  

## 2015-12-21 ENCOUNTER — Telehealth: Payer: Self-pay | Admitting: Internal Medicine

## 2015-12-21 ENCOUNTER — Other Ambulatory Visit: Payer: Medicare Other

## 2015-12-21 NOTE — Telephone Encounter (Signed)
Pt is take thyroid medication on empty stomach and pt has been scheduled for 4wk TSH lab.

## 2015-12-27 ENCOUNTER — Telehealth: Payer: Self-pay | Admitting: Internal Medicine

## 2015-12-27 NOTE — Telephone Encounter (Signed)
FYI

## 2015-12-27 NOTE — Telephone Encounter (Signed)
Yes, Kenney Housemananya has this paperwork to start on.

## 2015-12-27 NOTE — Telephone Encounter (Signed)
Pt's daughter-in-law dropped off renewal papers for handicap to be filled out. Paper is in Dr. Tilman NeatWalker's box.

## 2015-12-28 ENCOUNTER — Encounter: Payer: Self-pay | Admitting: Internal Medicine

## 2015-12-28 NOTE — Telephone Encounter (Signed)
Patient daughter aware handicap form is ready for pick up.  Form is up front for pick up.  Copy made to be scanned.

## 2015-12-28 NOTE — Telephone Encounter (Signed)
Form completed and given to Dr. Dan HumphreysWalker

## 2016-01-20 ENCOUNTER — Other Ambulatory Visit (INDEPENDENT_AMBULATORY_CARE_PROVIDER_SITE_OTHER): Payer: Medicare Other

## 2016-01-20 DIAGNOSIS — E038 Other specified hypothyroidism: Secondary | ICD-10-CM

## 2016-01-20 LAB — TSH: TSH: 3.9 u[IU]/mL (ref 0.35–4.50)

## 2016-01-23 ENCOUNTER — Telehealth: Payer: Self-pay

## 2016-01-23 NOTE — Telephone Encounter (Signed)
Clonazepam has been denied by insurance.  Per insurance this medication is covered for anxiety or insomnia.  They did not list any alternatives.  Please advise is there is something else patient can have or we can try filing an appeal.

## 2016-01-23 NOTE — Telephone Encounter (Signed)
-----   Message from Shelia MediaJennifer A Walker, MD sent at 01/21/2016  6:03 PM EDT ----- Follow up thyroid levels were at goal. Continue current dose of Levothyroxine. Follow up as scheduled.

## 2016-01-23 NOTE — Telephone Encounter (Signed)
Left message for patient to call office to inform her of lab results and recommendations.

## 2016-01-23 NOTE — Telephone Encounter (Signed)
This encounter was created in error - please disregard.

## 2016-01-24 NOTE — Telephone Encounter (Signed)
Left detailed message on machine with lab result per DPR.

## 2016-01-24 NOTE — Telephone Encounter (Signed)
Patient returned a call in reference to lab results.

## 2016-01-25 ENCOUNTER — Other Ambulatory Visit: Payer: Self-pay | Admitting: Internal Medicine

## 2016-02-04 ENCOUNTER — Telehealth: Payer: Self-pay

## 2016-02-04 NOTE — Telephone Encounter (Signed)
Patient is on the list for [employer] 2017 and may be a good candidate for an AWV in 2017. Please let me know if/when appt is scheduled.   

## 2016-02-06 NOTE — Telephone Encounter (Signed)
Thank you.  Will follow as appropriate. 

## 2016-02-29 ENCOUNTER — Ambulatory Visit: Payer: Medicare Other | Admitting: Internal Medicine

## 2016-03-08 ENCOUNTER — Telehealth: Payer: Self-pay | Admitting: Internal Medicine

## 2016-03-08 ENCOUNTER — Encounter: Payer: Self-pay | Admitting: Internal Medicine

## 2016-03-08 ENCOUNTER — Ambulatory Visit (INDEPENDENT_AMBULATORY_CARE_PROVIDER_SITE_OTHER): Payer: Medicare Other | Admitting: Internal Medicine

## 2016-03-08 VITALS — BP 132/96 | HR 114 | Ht 60.0 in | Wt 94.0 lb

## 2016-03-08 DIAGNOSIS — I480 Paroxysmal atrial fibrillation: Secondary | ICD-10-CM

## 2016-03-08 DIAGNOSIS — E538 Deficiency of other specified B group vitamins: Secondary | ICD-10-CM | POA: Diagnosis not present

## 2016-03-08 DIAGNOSIS — R64 Cachexia: Secondary | ICD-10-CM | POA: Diagnosis not present

## 2016-03-08 DIAGNOSIS — F0390 Unspecified dementia without behavioral disturbance: Secondary | ICD-10-CM

## 2016-03-08 DIAGNOSIS — Z634 Disappearance and death of family member: Secondary | ICD-10-CM | POA: Insufficient documentation

## 2016-03-08 DIAGNOSIS — E038 Other specified hypothyroidism: Secondary | ICD-10-CM | POA: Diagnosis not present

## 2016-03-08 DIAGNOSIS — M1711 Unilateral primary osteoarthritis, right knee: Secondary | ICD-10-CM

## 2016-03-08 LAB — CBC WITH DIFFERENTIAL/PLATELET
Basophils Absolute: 0.1 10*3/uL (ref 0.0–0.1)
Basophils Relative: 0.5 % (ref 0.0–3.0)
EOS ABS: 0.1 10*3/uL (ref 0.0–0.7)
EOS PCT: 0.5 % (ref 0.0–5.0)
HEMATOCRIT: 44.4 % (ref 36.0–46.0)
HEMOGLOBIN: 14.5 g/dL (ref 12.0–15.0)
LYMPHS PCT: 10.4 % — AB (ref 12.0–46.0)
Lymphs Abs: 1.2 10*3/uL (ref 0.7–4.0)
MCHC: 32.6 g/dL (ref 30.0–36.0)
MCV: 95.7 fl (ref 78.0–100.0)
Monocytes Absolute: 1 10*3/uL (ref 0.1–1.0)
Monocytes Relative: 8.4 % (ref 3.0–12.0)
NEUTROS ABS: 9.5 10*3/uL — AB (ref 1.4–7.7)
Neutrophils Relative %: 80.2 % — ABNORMAL HIGH (ref 43.0–77.0)
PLATELETS: 132 10*3/uL — AB (ref 150.0–400.0)
RBC: 4.64 Mil/uL (ref 3.87–5.11)
RDW: 14.5 % (ref 11.5–15.5)
WBC: 12 10*3/uL — ABNORMAL HIGH (ref 4.0–10.5)

## 2016-03-08 LAB — COMPREHENSIVE METABOLIC PANEL
ALBUMIN: 4.5 g/dL (ref 3.5–5.2)
ALT: 10 U/L (ref 0–35)
AST: 22 U/L (ref 0–37)
Alkaline Phosphatase: 68 U/L (ref 39–117)
BUN: 35 mg/dL — ABNORMAL HIGH (ref 6–23)
CALCIUM: 10.2 mg/dL (ref 8.4–10.5)
CHLORIDE: 101 meq/L (ref 96–112)
CO2: 29 meq/L (ref 19–32)
Creatinine, Ser: 1.1 mg/dL (ref 0.40–1.20)
GFR: 49.86 mL/min — AB (ref >60.00)
Glucose, Bld: 107 mg/dL — ABNORMAL HIGH (ref 70–99)
POTASSIUM: 4.4 meq/L (ref 3.5–5.1)
Sodium: 140 mEq/L (ref 135–145)
Total Bilirubin: 0.9 mg/dL (ref 0.2–1.2)
Total Protein: 7 g/dL (ref 6.0–8.3)

## 2016-03-08 LAB — EKG 12-LEAD

## 2016-03-08 LAB — TSH: TSH: 7.5 u[IU]/mL — ABNORMAL HIGH (ref 0.35–4.50)

## 2016-03-08 MED ORDER — CYANOCOBALAMIN 1000 MCG/ML IJ SOLN
1000.0000 ug | Freq: Once | INTRAMUSCULAR | Status: AC
  Administered 2016-03-08: 1000 ug via INTRAMUSCULAR

## 2016-03-08 NOTE — Assessment & Plan Note (Signed)
Her sister is actively dying. Offered support today.

## 2016-03-08 NOTE — Telephone Encounter (Signed)
Patient has been scheduled and notified.

## 2016-03-08 NOTE — Assessment & Plan Note (Signed)
Will check TSH with labs. Continue Levothyroxine. 

## 2016-03-08 NOTE — Telephone Encounter (Signed)
F[place] to [place] to create slot.

## 2016-03-08 NOTE — Telephone Encounter (Signed)
Please advise an appt time? thanks

## 2016-03-08 NOTE — Addendum Note (Signed)
Addended by: Elise BenneBOOTH, Vedder Brittian T on: 03/08/2016 03:21 PM   Modules accepted: Orders

## 2016-03-08 NOTE — Assessment & Plan Note (Signed)
Symptoms relatively stable. She now has in-home care as well as close monitoring by family members. Will continue to follow.

## 2016-03-08 NOTE — Progress Notes (Signed)
Subjective:    Patient ID: Elizabeth Horne, female    DOB: 1926/06/01, 82 y.o.   MRN: [id number]  HPI  82YO female presents for follow up.  Difficult time for her. Sister is actively dying. She visited her yesterday and reports feeling anxious. She notes palpitations over last few days and some mild shortness of breath. She attributes this to anxiety. She reports compliance with her medication. She denies chest pain. She reports continued fatigue. Questions if she needs continued B12 shots.  She also notes aching pain in her right knee. No swelling noted. Ongoing off and on for years, worse over last few weeks. Previously seen by Dr. Rosita KeaMenz. Taking Meloxicam with no improvement.  Wt Readings from Last 3 Encounters:  03/08/16 94 lb (42.638 kg)  11/16/15 94 lb (42.638 kg)  10/19/15 94 lb 8 oz (42.865 kg)   BP Readings from Last 3 Encounters:  03/08/16 132/96  11/16/15 122/74  10/19/15 102/68    Past Medical History  Diagnosis Date  . Arthritis   . Hypertension   . Hyperlipidemia   . Thyroid disease   . A-fib [facility](HCC)    Family History  Problem Relation Age of Onset  . Lung disease Sister    Past Surgical History  Procedure Laterality Date  . Tonsillectomy and adenoidectomy    . Total hip arthroplasty Bilateral   . Thyroidectomy, partial      benign  . Total shoulder arthroplasty Bilateral   . Kyphoplasty  2015    x 2, Dr. Rosita KeaMenz  . Abdominal hysterectomy      menorrhagia  . Vaginal delivery      2  . Joint replacement    . Craniotomy     Social History   Social History  . Marital Status: Widowed    Spouse Name: N/A  . Number of Children: N/A  . Years of Education: N/A   Social History Main Topics  . Smoking status: Former Smoker    Types: Cigarettes    Quit date: 06/09/1987  . Smokeless tobacco: Never Used  . Alcohol Use: 0.6 oz/week    1 Glasses of wine per week     Comment: daily glass of wine or 2  . Drug Use: No  . Sexual Activity: Not Asked    Other Topics Concern  . None   Social History Narrative   [place] in [place]. Has 10hr assistance during the day.      Two children.          Review of Systems  Constitutional: Positive for fatigue. Negative for fever, chills, appetite change and unexpected weight change.  HENT: Negative for congestion and trouble swallowing.   Eyes: Negative for visual disturbance.  Respiratory: Positive for shortness of breath. Negative for cough and chest tightness.   Cardiovascular: Positive for palpitations. Negative for chest pain and leg swelling.  Gastrointestinal: Negative for nausea, vomiting, abdominal pain, diarrhea and constipation.  Musculoskeletal: Positive for arthralgias (right knee).  Skin: Negative for color change and rash.  Hematological: Negative for adenopathy. Does not bruise/bleed easily.  Psychiatric/Behavioral: Positive for dysphoric mood. Negative for suicidal ideas and sleep disturbance. The patient is nervous/anxious.        Objective:    BP 132/96 mmHg  Pulse 114  Ht 5' (1.524 m)  Wt 94 lb (42.638 kg)  BMI 18.36 kg/m2  SpO2 90% Physical Exam  Constitutional: She is oriented to person, place, and time. She appears well-developed and well-nourished. No distress.  HENT:  Head: Normocephalic and atraumatic.  Right Ear: External ear normal.  Left Ear: External ear normal.  Nose: Nose normal.  Mouth/Throat: Oropharynx is clear and moist. No oropharyngeal exudate.  Eyes: Conjunctivae are normal. Pupils are equal, round, and reactive to light. Right eye exhibits no discharge. Left eye exhibits no discharge. No scleral icterus.  Neck: Normal range of motion. Neck supple. No tracheal deviation present. No thyromegaly present.  Cardiovascular: Normal rate, normal heart sounds and intact distal pulses.  An irregularly irregular rhythm present. Exam reveals no gallop and no friction rub.   No murmur heard. Pulmonary/Chest: Effort normal and breath sounds normal. No  respiratory distress. She has no wheezes. She has no rales. She exhibits no tenderness.  Musculoskeletal: Normal range of motion. She exhibits no edema or tenderness.  Lymphadenopathy:    She has no cervical adenopathy.  Neurological: She is alert and oriented to person, place, and time. No cranial nerve deficit. She exhibits normal muscle tone. Coordination normal.  Skin: Skin is warm and dry. No rash noted. She is not diaphoretic. No erythema. No pallor.  Psychiatric: Her speech is normal and behavior is normal. Judgment and thought content normal. Her mood appears anxious. She exhibits a depressed mood. She expresses no suicidal ideation.          Assessment & Plan:   Problem List Items Addressed This Visit      Unprioritized   Arthritis, degenerative    Will set up follow up with orthopedics, Dr. Rosita KeaMenz, for chronic OA right knee      Relevant Orders   Ambulatory referral to Orthopedic Surgery   Atrial fibrillation (HCC)    Afib with increased rate. Likely exacerbated by anxiety with current situation with her sister. She denies missing any doses of Diltiazem. Will monitor closely. Recheck early next week and prn. Will also forward information to her cardiologist. Return precautions given.      Relevant Orders   EKG 12-Lead (Completed)   Bereavement    Her sister is actively dying. Offered support today.      Cachexia (HCC) (Chronic)    Wt Readings from Last 3 Encounters:  03/08/16 94 lb (42.638 kg)  11/16/15 94 lb (42.638 kg)  10/19/15 94 lb 8 oz (42.865 kg)   Weight has been stable. Encouraged high calorie diet. Will continue to monitor.      Relevant Orders   CBC with Differential/Platelet   Comprehensive metabolic panel   Dementia - Primary (Chronic)    Symptoms relatively stable. She now has in-home care as well as close monitoring by family members. Will continue to follow.      Hypothyroidism, secondary    Will check TSH with labs. Continue Levothyroxine.       Relevant Orders   TSH       Return in about 1 week (around 03/15/2016) for Recheck.  Ronna PolioJennifer Walker, MD Internal Medicine [facility]

## 2016-03-08 NOTE — Patient Instructions (Signed)
Labs today.  Follow up for recheck in 1 week.

## 2016-03-08 NOTE — Telephone Encounter (Signed)
Pt needs to be seen 1 week 03/15/2016 for a 30 min appt. Let me know.   Call pt @ [phone]. Thank you!

## 2016-03-08 NOTE — Assessment & Plan Note (Signed)
Will set up follow up with orthopedics, Dr. Rosita KeaMenz, for chronic OA right knee

## 2016-03-08 NOTE — Assessment & Plan Note (Signed)
Wt Readings from Last 3 Encounters:  03/08/16 94 lb (42.638 kg)  11/16/15 94 lb (42.638 kg)  10/19/15 94 lb 8 oz (42.865 kg)   Weight has been stable. Encouraged high calorie diet. Will continue to monitor.

## 2016-03-08 NOTE — Telephone Encounter (Signed)
1pm on Thursday 7/13 for 30min

## 2016-03-08 NOTE — Assessment & Plan Note (Addendum)
Afib with increased rate. Likely exacerbated by anxiety with current situation with her sister. She denies missing any doses of Diltiazem. Will monitor closely. Recheck early next week and prn. Will also forward information to her cardiologist. Return precautions given.

## 2016-03-12 ENCOUNTER — Telehealth: Payer: Self-pay

## 2016-03-12 MED ORDER — LEVOTHYROXINE SODIUM 75 MCG PO TABS: 100.0000 ug | ORAL_TABLET | Freq: Every day | ORAL | Status: AC

## 2016-03-12 NOTE — Telephone Encounter (Signed)
Per lab results a change in medication

## 2016-03-14 NOTE — Telephone Encounter (Signed)
AWV appointment scheduled after follow up with PCP tomorrow.

## 2016-03-15 ENCOUNTER — Ambulatory Visit: Payer: Medicare Other | Admitting: Internal Medicine

## 2016-03-15 ENCOUNTER — Ambulatory Visit (INDEPENDENT_AMBULATORY_CARE_PROVIDER_SITE_OTHER): Payer: Medicare Other

## 2016-03-15 VITALS — BP 118/70 | HR 88 | Temp 98.2°F | Resp 12 | Ht 60.0 in | Wt 93.1 lb

## 2016-03-15 DIAGNOSIS — Z0000 Encounter for general adult medical examination without abnormal findings: Secondary | ICD-10-CM | POA: Diagnosis not present

## 2016-03-15 NOTE — Progress Notes (Signed)
Annual Wellness Visit as completed by Health Coach was reviewed in full.  

## 2016-03-15 NOTE — Patient Instructions (Addendum)
  Ms. Elizabeth Horne , Thank you for taking time to come for your Medicare Wellness Visit. I appreciate your ongoing commitment to your health goals. Please review the following plan we discussed and let me know if I can assist you in the future.   FOLLOW UP WITH PROVIDER AS NEEDED.   This is a list of the screening recommended for you and due dates:  Health Maintenance  Topic Date Due  . Shingles Vaccine  03/15/2017*  . Tetanus Vaccine  03/15/2017*  . Flu Shot  04/03/2016  . DEXA scan (bone density measurement)  Completed  . Pneumonia vaccines  Completed  *Topic was postponed. The date shown is not the original due date.

## 2016-03-15 NOTE — Progress Notes (Signed)
Subjective:   Elizabeth Horne is a 82 y.o. female who presents for an Initial Medicare Annual Wellness Visit.  Review of Systems    No ROS.  Medicare Wellness Visit.  Cardiac Risk Factors include: advanced age (>1555men, 77>65 women);hypertension     Objective:    Today's Vitals   03/15/16 1253  BP: 118/70  Pulse: 88  Temp: 98.2 F (36.8 C)  TempSrc: Oral  Resp: 12  Height: 5' (1.524 m)  Weight: 93 lb 1.9 oz (42.239 kg)  SpO2: 96%   Body mass index is 18.19 kg/(m^2).   Current Medications (verified) Outpatient Encounter Prescriptions as of 03/15/2016  Medication Sig  . aspirin 81 MG tablet Take 81 mg by mouth daily.  . cetirizine (ZYRTEC) 10 MG tablet Take 10 mg by mouth daily as needed for allergies or rhinitis.   . Cholecalciferol (VITAMIN D3) 2000 units capsule Take 1 capsule (2,000 Units total) by mouth daily.  . cyanocobalamin (,VITAMIN B-12,) 1000 MCG/ML injection INJECT 1 ML IM EACH MONTH  . diltiazem (CARDIZEM CD) 240 MG 24 hr capsule Take 1 capsule (240 mg total) by mouth once.  . furosemide (LASIX) 20 MG tablet Take by mouth.  . levothyroxine (SYNTHROID, LEVOTHROID) 75 MCG tablet Take 1.5 tablets (112.5 mcg total) by mouth daily before breakfast.  . meloxicam (MOBIC) 15 MG tablet Take 15 mg by mouth daily.  . mirtazapine (REMERON) 7.5 MG tablet TAKE ONE TABLET BY MOUTH AT BEDTIME  . omeprazole (PRILOSEC) 20 MG capsule TAKE 1 CAPSULE BY MOUTH EVERY DAY  . simvastatin (ZOCOR) 40 MG tablet TAKE ONE TABLET BY MOUTH DAILY   No facility-administered encounter medications on file as of 03/15/2016.    Allergies (verified) Tramadol   History: Past Medical History  Diagnosis Date  . Arthritis   . Hypertension   . Hyperlipidemia   . Thyroid disease   . A-fib [facility](HCC)    Past Surgical History  Procedure Laterality Date  . Tonsillectomy and adenoidectomy    . Total hip arthroplasty Bilateral   . Thyroidectomy, partial      benign  . Total shoulder arthroplasty  Bilateral   . Kyphoplasty  2015    x 2, Dr. Rosita KeaMenz  . Abdominal hysterectomy      menorrhagia  . Vaginal delivery      2  . Joint replacement    . Craniotomy     Family History  Problem Relation Age of Onset  . Lung disease Sister    Social History   Occupational History  . Not on file.   Social History Main Topics  . Smoking status: Former Smoker    Types: Cigarettes    Quit date: 06/09/1987  . Smokeless tobacco: Never Used  . Alcohol Use: 0.6 oz/week    1 Glasses of wine per week     Comment: daily glass of wine or 2  . Drug Use: No  . Sexual Activity: No    Tobacco Counseling Counseling given: Not Answered   Activities of Daily Living In your present state of health, do you have any difficulty performing the following activities: 03/15/2016  Hearing? Y  Vision? N  Difficulty concentrating or making decisions? Y  Walking or climbing stairs? Y  Dressing or bathing? N  Doing errands, shopping? Y  Preparing Food and eating ? N  Using the Toilet? N  In the past six months, have you accidently leaked urine? N  Do you have problems with loss of bowel control? N  Managing your Medications? Y  Managing your Finances? Y  Housekeeping or managing your Housekeeping? Y    Immunizations and Health Maintenance Immunization History  Administered Date(s) Administered  . Influenza,inj,Quad PF,36+ Mos 06/08/2014, 06/29/2015  . Pneumococcal Conjugate-13 06/08/2014  . Pneumococcal Polysaccharide-23 11/18/2010   There are no preventive care reminders to display for this patient.  Patient Care Team: Shelia Media, MD as PCP - General (Internal Medicine)  Indicate any recent Medical Services you may have received from other than [facility] providers in the past year (date may be approximate).     Assessment:   This is a routine wellness examination for Elizabeth Horne.  The goal of the wellness visit is to assist the patient how to close the gaps in care and create a preventative care  plan for the patient.   Taking calcium VIT D as appropriate/Osteoporosis reviewed.  Medications reviewed; taking without issues or barriers.Pharmacy assists with separating daily medications in a pill box prior to pick up.  Daughter in law Elizabeth Horne assists with medication management.  Safety issues reviewed; smoke detectors in the home. No firearms in the home. Wears seatbelts when riding with others. No violence in the home.  No identified risk were noted; The patient was oriented x 3; appropriate in dress and manner.    Moderate dementia, some assistance requires in the home.    TDAP and ZOSTAVAX vaccine postponed per patient request.    Patient Concerns: None at this time. Follow up with PCP as needed.  Hearing/Vision screen Hearing Screening Comments: Followed by [facility] Wears hearing aids Vision Screening Comments: Followed by [facility], Dr. Clementeen Hoof Last OV 3 years ago  Dietary issues and exercise activities discussed: Current Exercise Habits: Home exercise routine, Type of exercise: stretching (Leg lifts. Chair exercises.), Frequency (Times/Week): 2, Intensity: Mild  Goals    . Healthy Lifestyle     STAY HYDRATED AND DRINK PLENTY OF FLUIDS.   STAY ACTIVE AND CONTINUE STRETCHING, LEG LIFTS AND CHAIR EXERCISES. EAT A HEALTHY DIET.      Depression Screen PHQ 2/9 Scores 03/15/2016 03/08/2016  Exception Documentation - Other- indicate reason in comment box  Not completed Sister recently passed away.  Overall, doing ok, but still a little sad. feeling fine overall but sister is not doing very well and she is very sad    Fall Risk Fall Risk  03/15/2016 03/08/2016 06/08/2014  Falls in the past year? No No Yes  Number falls in past yr: - - 2 or more  Risk Factor Category  - - High Fall Risk  Risk for fall due to : - - History of fall(s);Impaired balance/gait;Impaired mobility    Cognitive Function: MMSE - Mini Mental State Exam 03/15/2016  Orientation to time  4  Orientation to Place 5  Registration 3  Attention/ Calculation 5  Recall 2  Language- name 2 objects 2  Language- repeat 1  Language- follow 3 step command 3  Language- read & follow direction 1  Write a sentence 1  Copy design 1  Total score 28    Screening Tests Health Maintenance  Topic Date Due  . ZOSTAVAX  03/15/2017 (Originally 06/28/1988)  . TETANUS/TDAP  03/15/2017 (Originally 06/29/1945)  . INFLUENZA VACCINE  04/03/2016  . DEXA SCAN  Completed  . PNA vac Low Risk Adult  Completed      Plan:   End of life planning; Advance aging; Advanced directives discussed. Copy of current HCPOA/Living Will requested.   During the course of  the visit, Kylinn was educated and counseled about the following appropriate screening and preventive services:   Vaccines to include Pneumoccal, Influenza, Hepatitis B, Td, Zostavax, HCV  Electrocardiogram  Cardiovascular disease screening  Colorectal cancer screening  Bone density screening  Diabetes screening  Glaucoma screening  Mammography/PAP  Nutrition counseling  Smoking cessation counseling  Patient Instructions (the written plan) were given to the patient.    Ashok Pall, LPN   [id number]

## 2016-03-21 ENCOUNTER — Encounter: Payer: Self-pay | Admitting: Emergency Medicine

## 2016-03-21 ENCOUNTER — Telehealth: Payer: Self-pay | Admitting: Family Medicine

## 2016-03-21 ENCOUNTER — Emergency Department: Payer: Medicare Other

## 2016-03-21 ENCOUNTER — Inpatient Hospital Stay
Admission: EM | Admit: 2016-03-21 | Discharge: 2016-03-23 | DRG: 066 | Disposition: A | Discharge status: Home Health | Payer: Medicare Other | Attending: Internal Medicine | Admitting: Internal Medicine

## 2016-03-21 ENCOUNTER — Inpatient Hospital Stay
Admit: 2016-03-21 | Discharge: 2016-03-21 | Disposition: A | Discharge status: Home / Self Care | Payer: Medicare Other | Attending: Internal Medicine | Admitting: Internal Medicine

## 2016-03-21 ENCOUNTER — Inpatient Hospital Stay: Payer: Medicare Other

## 2016-03-21 DIAGNOSIS — E039 Hypothyroidism, unspecified: Secondary | ICD-10-CM | POA: Diagnosis present

## 2016-03-21 DIAGNOSIS — I63512 Cerebral infarction due to unspecified occlusion or stenosis of left middle cerebral artery: Secondary | ICD-10-CM | POA: Diagnosis present

## 2016-03-21 DIAGNOSIS — Z96611 Presence of right artificial shoulder joint: Secondary | ICD-10-CM | POA: Diagnosis present

## 2016-03-21 DIAGNOSIS — Z87891 Personal history of nicotine dependence: Secondary | ICD-10-CM | POA: Diagnosis not present

## 2016-03-21 DIAGNOSIS — Z791 Long term (current) use of non-steroidal anti-inflammatories (NSAID): Secondary | ICD-10-CM | POA: Diagnosis not present

## 2016-03-21 DIAGNOSIS — R29703 NIHSS score 3: Secondary | ICD-10-CM | POA: Diagnosis present

## 2016-03-21 DIAGNOSIS — G459 Transient cerebral ischemic attack, unspecified: Secondary | ICD-10-CM

## 2016-03-21 DIAGNOSIS — R4701 Aphasia: Secondary | ICD-10-CM | POA: Diagnosis present

## 2016-03-21 DIAGNOSIS — I10 Essential (primary) hypertension: Secondary | ICD-10-CM | POA: Diagnosis present

## 2016-03-21 DIAGNOSIS — Z79899 Other long term (current) drug therapy: Secondary | ICD-10-CM

## 2016-03-21 DIAGNOSIS — Z96612 Presence of left artificial shoulder joint: Secondary | ICD-10-CM | POA: Diagnosis present

## 2016-03-21 DIAGNOSIS — I482 Chronic atrial fibrillation: Secondary | ICD-10-CM | POA: Diagnosis present

## 2016-03-21 DIAGNOSIS — Z7982 Long term (current) use of aspirin: Secondary | ICD-10-CM | POA: Diagnosis not present

## 2016-03-21 DIAGNOSIS — E785 Hyperlipidemia, unspecified: Secondary | ICD-10-CM | POA: Diagnosis present

## 2016-03-21 DIAGNOSIS — R4781 Slurred speech: Secondary | ICD-10-CM

## 2016-03-21 DIAGNOSIS — R29810 Facial weakness: Secondary | ICD-10-CM | POA: Diagnosis present

## 2016-03-21 DIAGNOSIS — I639 Cerebral infarction, unspecified: Secondary | ICD-10-CM | POA: Diagnosis not present

## 2016-03-21 DIAGNOSIS — Z96643 Presence of artificial hip joint, bilateral: Secondary | ICD-10-CM | POA: Diagnosis present

## 2016-03-21 HISTORY — DX: Traumatic subdural hemorrhage with loss of consciousness status unknown, initial encounter: S06.5XAA

## 2016-03-21 HISTORY — DX: Traumatic subdural hemorrhage with loss of consciousness of unspecified duration, initial encounter: S06.5X9A

## 2016-03-21 LAB — COMPREHENSIVE METABOLIC PANEL
ALBUMIN: 4.2 g/dL (ref 3.5–5.0)
ALT: 12 U/L — ABNORMAL LOW (ref 14–54)
ANION GAP: 7 (ref 5–15)
AST: 27 U/L (ref 15–41)
Alkaline Phosphatase: 65 U/L (ref 38–126)
BILIRUBIN TOTAL: 0.7 mg/dL (ref 0.3–1.2)
BUN: 43 mg/dL — AB (ref 6–20)
CALCIUM: 9.7 mg/dL (ref 8.9–10.3)
CO2: 32 mmol/L (ref 22–32)
Chloride: 100 mmol/L — ABNORMAL LOW (ref 101–111)
Creatinine, Ser: 1.13 mg/dL — ABNORMAL HIGH (ref 0.44–1.00)
GFR calc Af Amer: 49 mL/min — ABNORMAL LOW (ref >60)
GFR, EST NON AFRICAN AMERICAN: 42 mL/min — AB (ref >60)
GLUCOSE: 122 mg/dL — AB (ref 65–99)
POTASSIUM: 4.5 mmol/L (ref 3.5–5.1)
Sodium: 139 mmol/L (ref 135–145)
TOTAL PROTEIN: 7 g/dL (ref 6.5–8.1)

## 2016-03-21 LAB — TROPONIN I

## 2016-03-21 LAB — DIFFERENTIAL
Basophils Absolute: 0.1 10*3/uL (ref 0–0.1)
Basophils Relative: 1 %
EOS ABS: 0.1 10*3/uL (ref 0–0.7)
EOS PCT: 1 %
LYMPHS ABS: 1.2 10*3/uL (ref 1.0–3.6)
LYMPHS PCT: 14 %
MONOS PCT: 9 %
Monocytes Absolute: 0.7 10*3/uL (ref 0.2–0.9)
Neutro Abs: 6.2 10*3/uL (ref 1.4–6.5)
Neutrophils Relative %: 75 %

## 2016-03-21 LAB — CBC
HEMATOCRIT: 43.9 % (ref 35.0–47.0)
HEMOGLOBIN: 14.7 g/dL (ref 12.0–16.0)
MCH: 32.2 pg (ref 26.0–34.0)
MCHC: 33.5 g/dL (ref 32.0–36.0)
MCV: 96 fL (ref 80.0–100.0)
Platelets: 214 10*3/uL (ref 150–440)
RBC: 4.57 MIL/uL (ref 3.80–5.20)
RDW: 13.8 % (ref 11.5–14.5)
WBC: 8.2 10*3/uL (ref 3.6–11.0)

## 2016-03-21 LAB — PROTIME-INR
INR: 0.99
Prothrombin Time: 13.3 seconds (ref 11.4–15.0)

## 2016-03-21 LAB — APTT: aPTT: 28 seconds (ref 24–36)

## 2016-03-21 LAB — GLUCOSE, CAPILLARY: Glucose-Capillary: 124 mg/dL — ABNORMAL HIGH (ref 65–99)

## 2016-03-21 MED ORDER — LORATADINE 10 MG PO TABS
10.0000 mg | ORAL_TABLET | Freq: Every day | ORAL | Status: DC
  Administered 2016-03-22 – 2016-03-23 (×2): 10 mg via ORAL
  Filled 2016-03-21 (×3): qty 1

## 2016-03-21 MED ORDER — STROKE: EARLY STAGES OF RECOVERY BOOK
Freq: Once | Status: AC
  Administered 2016-03-21: 19:00:00

## 2016-03-21 MED ORDER — VITAMIN D 1000 UNITS PO TABS
2000.0000 [IU] | ORAL_TABLET | Freq: Every day | ORAL | Status: DC
  Administered 2016-03-22 – 2016-03-23 (×2): 2000 [IU] via ORAL
  Filled 2016-03-21 (×5): qty 2

## 2016-03-21 MED ORDER — ASPIRIN EC 81 MG PO TBEC
81.0000 mg | DELAYED_RELEASE_TABLET | Freq: Every day | ORAL | Status: DC
  Administered 2016-03-22 – 2016-03-23 (×2): 81 mg via ORAL
  Filled 2016-03-21 (×3): qty 1

## 2016-03-21 MED ORDER — MIRTAZAPINE 15 MG PO TABS
7.5000 mg | ORAL_TABLET | Freq: Every day | ORAL | Status: DC
  Administered 2016-03-21 – 2016-03-22 (×2): 7.5 mg via ORAL
  Filled 2016-03-21 (×2): qty 1

## 2016-03-21 MED ORDER — LEVOTHYROXINE SODIUM 100 MCG PO TABS
100.0000 ug | ORAL_TABLET | Freq: Every day | ORAL | Status: DC
  Administered 2016-03-22 – 2016-03-23 (×2): 100 ug via ORAL
  Filled 2016-03-21 (×2): qty 1

## 2016-03-21 MED ORDER — ENOXAPARIN SODIUM 30 MG/0.3ML ~~LOC~~ SOLN
30.0000 mg | SUBCUTANEOUS | Status: DC
  Administered 2016-03-21 – 2016-03-22 (×2): 30 mg via SUBCUTANEOUS
  Filled 2016-03-21 (×2): qty 0.3

## 2016-03-21 MED ORDER — MELOXICAM 7.5 MG PO TABS
15.0000 mg | ORAL_TABLET | Freq: Every day | ORAL | Status: DC
  Administered 2016-03-22 – 2016-03-23 (×2): 15 mg via ORAL
  Filled 2016-03-21 (×3): qty 2

## 2016-03-21 MED ORDER — DILTIAZEM HCL ER COATED BEADS 120 MG PO CP24
240.0000 mg | ORAL_CAPSULE | Freq: Every day | ORAL | Status: DC
  Administered 2016-03-21 – 2016-03-23 (×3): 240 mg via ORAL
  Filled 2016-03-21 (×3): qty 2

## 2016-03-21 MED ORDER — ASPIRIN 81 MG PO CHEW
324.0000 mg | CHEWABLE_TABLET | Freq: Once | ORAL | Status: AC
  Administered 2016-03-21: 324 mg via ORAL
  Filled 2016-03-21: qty 4

## 2016-03-21 MED ORDER — FUROSEMIDE 20 MG PO TABS
20.0000 mg | ORAL_TABLET | Freq: Every day | ORAL | Status: DC
  Administered 2016-03-22 – 2016-03-23 (×2): 20 mg via ORAL
  Filled 2016-03-21 (×3): qty 1

## 2016-03-21 MED ORDER — PANTOPRAZOLE SODIUM 40 MG PO TBEC
40.0000 mg | DELAYED_RELEASE_TABLET | Freq: Every day | ORAL | Status: DC
  Administered 2016-03-22 – 2016-03-23 (×2): 40 mg via ORAL
  Filled 2016-03-21 (×3): qty 1

## 2016-03-21 MED ORDER — SIMVASTATIN 40 MG PO TABS
40.0000 mg | ORAL_TABLET | Freq: Every day | ORAL | Status: DC
  Administered 2016-03-21: 40 mg via ORAL
  Filled 2016-03-21: qty 1

## 2016-03-21 MED ORDER — SENNOSIDES-DOCUSATE SODIUM 8.6-50 MG PO TABS
1.0000 | ORAL_TABLET | Freq: Every evening | ORAL | Status: DC | PRN
Start: 2016-03-21 — End: 2016-03-23

## 2016-03-21 MED ORDER — SODIUM CHLORIDE 0.9 % IV SOLN
INTRAVENOUS | Status: DC
  Administered 2016-03-21 – 2016-03-23 (×3): via INTRAVENOUS

## 2016-03-21 NOTE — H&P (Signed)
[facility] [place] [facility]   PATIENT NAME: Elizabeth Horne    MR#:  [id number]  DATE OF BIRTH:  Jun 19, 1926  DATE OF ADMISSION:  03/21/2016  PRIMARY CARE PHYSICIAN: Marikay AlarEric Sonnenberg, MD   REQUESTING/REFERRING PHYSICIAN: DR. Nita Sicklearolina Veronese  CHIEF COMPLAINT:   Chief Complaint  Patient presents with  . Code Stroke    HISTORY OF PRESENT ILLNESS: Elizabeth Horne  is a 82 y.o. female with a known history of Atrial fibrillation, history of subdural hematoma 4 years prior, hypertension, hyperlipidemia and hypothyroidism who presents with complaint of slurred speech and right-sided facial droop while she was at the hairdresser. Last known well time was 1300. Patient was seen by Dr. Thad Rangereynolds of neurology and was not given TPA due to her symptoms are very mild as well as history of subdural hematoma. Patient's speech is improved. Her she still has some right-sided facial droop. Denies any weakness of the extremity       PAST MEDICAL HISTORY:   Past Medical History  Diagnosis Date  . Arthritis   . Hypertension   . Hyperlipidemia   . Thyroid disease   . A-fib [facility](HCC)     PAST SURGICAL HISTORY: Past Surgical History  Procedure Laterality Date  . Tonsillectomy and adenoidectomy    . Total hip arthroplasty Bilateral   . Thyroidectomy, partial      benign  . Total shoulder arthroplasty Bilateral   . Kyphoplasty  2015    x 2, Dr. Rosita KeaMenz  . Abdominal hysterectomy      menorrhagia  . Vaginal delivery      2  . Joint replacement    . Craniotomy      SOCIAL HISTORY:  Social History  Substance Use Topics  . Smoking status: Former Smoker    Types: Cigarettes    Quit date: 06/09/1987  . Smokeless tobacco: Never Used  . Alcohol Use: 0.6 oz/week    1 Glasses of wine per week     Comment: daily glass of wine or 2    FAMILY HISTORY:  Family History  Problem Relation Age of Onset  . Lung disease Sister     DRUG ALLERGIES:  Allergies  Allergen Reactions   . Tramadol Nausea Only    REVIEW OF SYSTEMS:   CONSTITUTIONAL: No fever, fatigue or weakness.  EYES: No blurred or double vision.  EARS, NOSE, AND THROAT: No tinnitus or ear pain.  RESPIRATORY: No cough, shortness of breath, wheezing or hemoptysis.  CARDIOVASCULAR: No chest pain, orthopnea, edema.  GASTROINTESTINAL: No nausea, vomiting, diarrhea or abdominal pain.  GENITOURINARY: No dysuria, hematuria.  ENDOCRINE: No polyuria, nocturia,  HEMATOLOGY: No anemia, easy bruising or bleeding SKIN: No rash or lesion. MUSCULOSKELETAL: No joint pain or arthritis.   NEUROLOGIC: Positive slurred speech and right-sided facial droop PSYCHIATRY: No anxiety or depression.   MEDICATIONS AT HOME:  Prior to Admission medications   Medication Sig Start Date End Date Taking? Authorizing Provider  aspirin 81 MG tablet Take 81 mg by mouth daily.   Yes Historical Provider, MD  Cholecalciferol (VITAMIN D3) 2000 units capsule Take 1 capsule (2,000 Units total) by mouth daily. 09/08/15  Yes Shelia MediaJennifer A Walker, MD  cyanocobalamin (,VITAMIN B-12,) 1000 MCG/ML injection INJECT 1 ML IM EACH MONTH 09/07/14  Yes Shelia MediaJennifer A Walker, MD  diltiazem (CARDIZEM CD) 240 MG 24 hr capsule Take 240 mg by mouth daily.   Yes Historical Provider, MD  furosemide (LASIX) 20 MG tablet Take 20 mg by mouth  daily.  08/31/15 08/30/16 Yes Historical Provider, MD  levothyroxine (SYNTHROID, LEVOTHROID) 75 MCG tablet Take 1.5 tablets (112.5 mcg total) by mouth daily before breakfast. 03/12/16  Yes Shelia Media, MD  meloxicam (MOBIC) 15 MG tablet Take 15 mg by mouth daily.   Yes Historical Provider, MD  mirtazapine (REMERON) 7.5 MG tablet TAKE ONE TABLET BY MOUTH AT BEDTIME 11/24/15  Yes Shelia Media, MD  omeprazole (PRILOSEC) 20 MG capsule TAKE 1 CAPSULE BY MOUTH EVERY DAY 01/25/16  Yes Shelia Media, MD  simvastatin (ZOCOR) 40 MG tablet TAKE ONE TABLET BY MOUTH DAILY 11/24/15  Yes Shelia Media, MD  cetirizine (ZYRTEC) 10 MG  tablet Take 10 mg by mouth daily as needed for allergies or rhinitis.     Historical Provider, MD      PHYSICAL EXAMINATION:   VITAL SIGNS: Blood pressure 93/78, pulse 85, temperature 97.9 F (36.6 C), temperature source Oral, resp. rate 21, height 5' (1.524 m), weight 48.5 kg (106 lb 14.8 oz), SpO2 95 %.  GENERAL:  82 y.o.-year-old patient lying in the bed with no acute distress.  EYES: Pupils equal, round, reactive to light and accommodation. No scleral icterus. Extraocular muscles intact.  HEENT: Head atraumatic, normocephalic. Oropharynx and nasopharynx clear.  NECK:  Supple, no jugular venous distention. No thyroid enlargement, no tenderness.  LUNGS: Normal breath sounds bilaterally, no wheezing, rales,rhonchi or crepitation. No use of accessory muscles of respiration.  CARDIOVASCULAR: S1, S2 normal. No murmurs, rubs, or gallops.  ABDOMEN: Soft, nontender, nondistended. Bowel sounds present. No organomegaly or mass.  EXTREMITIES: No pedal edema, cyanosis, or clubbing.  NEUROLOGIC: Cranial nerves II through XII are intact. Muscle strength 5/5 in all extremities. Sensation intact. Gait not checked. Right-sided facial droop as well as slurred speech present PSYCHIATRIC: The patient is alert and oriented x 3.  SKIN: No obvious rash, lesion, or ulcer.   LABORATORY PANEL:   CBC  Recent Labs Lab 03/21/16 1354  WBC 8.2  HGB 14.7  HCT 43.9  PLT 214  MCV 96.0  MCH 32.2  MCHC 33.5  RDW 13.8  LYMPHSABS 1.2  MONOABS 0.7  EOSABS 0.1  BASOSABS 0.1   ------------------------------------------------------------------------------------------------------------------  Chemistries   Recent Labs Lab 03/21/16 1354  NA 139  K 4.5  CL 100*  CO2 32  GLUCOSE 122*  BUN 43*  CREATININE 1.13*  CALCIUM 9.7  AST 27  ALT 12*  ALKPHOS 65  BILITOT 0.7   ------------------------------------------------------------------------------------------------------------------ estimated  creatinine clearance is 25.2 mL/min (by C-G formula based on Cr of 1.13). ------------------------------------------------------------------------------------------------------------------ No results for input(s): TSH, T4TOTAL, T3FREE, THYROIDAB in the last 72 hours.  Invalid input(s): FREET3   Coagulation profile  Recent Labs Lab 03/21/16 1354  INR 0.99   ------------------------------------------------------------------------------------------------------------------- No results for input(s): DDIMER in the last 72 hours. -------------------------------------------------------------------------------------------------------------------  Cardiac Enzymes  Recent Labs Lab 03/21/16 1354  TROPONINI <0.03   ------------------------------------------------------------------------------------------------------------------ Invalid input(s): POCBNP  ---------------------------------------------------------------------------------------------------------------  Urinalysis    Component Value Date/Time   COLORURINE Yellow 12/18/2011 2110   APPEARANCEUR Clear 12/18/2011 2110   LABSPEC 1.018 12/18/2011 2110   PHURINE 5.0 12/18/2011 2110   GLUCOSEU Negative 12/18/2011 2110   HGBUR Negative 12/18/2011 2110   BILIRUBINUR Negative 12/18/2011 2110   KETONESUR 1+ 12/18/2011 2110   PROTEINUR Negative 12/18/2011 2110   NITRITE Negative 12/18/2011 2110   LEUKOCYTESUR Trace 12/18/2011 2110     RADIOLOGY: Ct Head Code Stroke W/o Cm  03/21/2016  CLINICAL DATA:  Acute onset slurred speech. Right-sided facial  droop. EXAM: CT HEAD WITHOUT CONTRAST TECHNIQUE: Contiguous axial images were obtained from the base of the skull through the vertex without intravenous contrast. COMPARISON:  Head CT 10/24/2013 FINDINGS: Brain parenchyma: There is periventricular hypoattenuation compatible with chronic microvascular disease. Cortical gray-white differentiation is preserved. The basal ganglia are normal.  Insular ribbons are preserved. No intraparenchymal hemorrhage or evidence of acute cortical infarct. No mass lesion or midline shift. Ventricles, sulci and extra-axial spaces: Normal for age. No extra-axial collection. Paranasal sinuses and mastoids: Normal. Visualized orbits: Normal. Skull and extracranial soft tissues: Old left frontal craniotomy defect. IMPRESSION: No acute hemorrhage or evidence of acute cortical infarct. These results were called by telephone at the time of interpretation on 03/21/2016 at 2:09 pm to Dr. Nita Sickle , who verbally acknowledged these results. Electronically Signed   By: Deatra Robinson M.D.   On: 03/21/2016 14:11    EKG: Orders placed or performed during the hospital encounter of 03/21/16  . ED EKG  . ED EKG    IMPRESSION AND PLAN: Patient is a 82 year old white female with history of atrial fibrillation presents with slurred speech and right-sided facial droop  1. Acute CVA: Seen by neurology will obtain MRI of the brain, MRA of the brain, as well as carotid Dopplers Continue aspirin for now Obtain echocardiogram of the heart Monitor on telemetry Speech eval for swallowing PT evaluation  2.  history of atrial fibrillation We'll monitor on off unit telemetry Continue Cardizem and asa  3. Hypothyroidism continue Synthroid  4. Hyperlipidemia continue therapy with simvastatin  5. Miscellaneous we'll do Lovenox for DVT prophylaxis       All the records are reviewed and case discussed with ED provider. Management plans discussed with the patient, family and they are in agreement.  CODE STATUS:Full    Code Status Orders        Start     Ordered   03/21/16 1520  Full code   Continuous     03/21/16 1522    Code Status History    Date Active Date Inactive Code Status Order ID Comments User Context   02/26/2015 12:19 PM 02/27/2015  2:05 PM Full Code [id number]  Gracelyn Nurse, MD Inpatient    Advance Directive Documentation        Most  Recent Value   Type of Advance Directive  Healthcare Power of Attorney, Living will   Pre-existing out of facility DNR order (yellow form or pink MOST form)     "MOST" Form in Place?         TOTAL TIME TAKING CARE OF THIS PATIENT:87minutes.    Auburn Bilberry M.D on 03/21/2016 at 3:24 PM  Between 7am to 6pm - Pager - [phone]  After 6pm go to [email or web address] - password [email or web address] [facility]  [facility]  Office  [phone]  CC: Primary care physician; Marikay Alar, MD

## 2016-03-21 NOTE — Telephone Encounter (Signed)
Had injection on 03/08/2016, please advise is she on monthly injections?

## 2016-03-21 NOTE — Telephone Encounter (Signed)
Pt called wanting to know should she be getting the B12 injections?   Call pt @ [phone]. Thank you!

## 2016-03-21 NOTE — ED Notes (Signed)
Pt presents to ED via EMS c/o slurred speech onset 30 min PTA. Pt was checking out of hair appt and hairdresser noted changes to speech and called EMS. Pt presents with slight facial droop to R side of face, EMS reports pt's has CNA care giver who was unable to tell if droop is new or not. EMS also reports pt's speech has improved in transit to ED.

## 2016-03-21 NOTE — ED Notes (Signed)
Hx subdural bleed.

## 2016-03-21 NOTE — Telephone Encounter (Signed)
Yes, monthly injections of B12

## 2016-03-21 NOTE — ED Provider Notes (Signed)
[facility] Emergency Department Provider Note  ____________________________________________  Time seen: Approximately 1:55 PM  I have reviewed the triage vital signs and the nursing notes.   HISTORY  Chief Complaint Code Stroke   HPI Elizabeth Horne is a 82 y.o. female h/o SDH, HTN, HLD, afib who presents as a code stroke. Patient was having her hair done when she started having difficulty speaking and writing with her R hand. She was last seen normal at 1 PM. Patient has a history of a prior subdural hematoma but no history of ischemic stroke. Patient at this time reports that she is still having trouble speaking but she is no longer having weakness or numbness of her right upper extremity. She denies headache, changes in vision, nausea, vomiting, chest pain, palpitations, shortness of breath, abdominal pain.  Past Medical History  Diagnosis Date  . Arthritis   . Hypertension   . Hyperlipidemia   . Thyroid disease   . A-fib [facility])     Patient Active Problem List   Diagnosis Date Noted  . Bereavement 03/08/2016  . Dementia 10/19/2015  . Baker's cyst of knee 03/16/2015  . Adjustment disorder with mixed anxiety and depressed mood 07/07/2014  . HLD (hyperlipidemia) 06/08/2014  . BP (high blood pressure) 06/08/2014  . Hypothyroidism, secondary 06/08/2014  . Osteoporosis, post-menopausal 06/08/2014  . Intracranial subdural hematoma (HCC) 06/08/2014  . Basilar artery insufficiency 06/08/2014  . Allergic rhinitis 06/08/2014  . Atrial fibrillation (HCC) 06/08/2014  . Cachexia (HCC) 06/08/2014  . H/O surgical procedure 01/29/2014  . Wedge fracture of thoracic vertebra (HCC) 01/29/2014  . CN (constipation) 01/29/2014  . Bronchitis, chronic (HCC) 01/11/2014  . H/O gastrointestinal disease 01/11/2014  . Arthritis, degenerative 01/11/2014    Past Surgical History  Procedure Laterality Date  . Tonsillectomy and adenoidectomy    . Total hip arthroplasty  Bilateral   . Thyroidectomy, partial      benign  . Total shoulder arthroplasty Bilateral   . Kyphoplasty  2015    x 2, Dr. Rosita Kea  . Abdominal hysterectomy      menorrhagia  . Vaginal delivery      2  . Joint replacement    . Craniotomy      Current Outpatient Rx  Name  Route  Sig  Dispense  Refill  . aspirin 81 MG tablet   Oral   Take 81 mg by mouth daily.         . cetirizine (ZYRTEC) 10 MG tablet   Oral   Take 10 mg by mouth daily as needed for allergies or rhinitis.          . Cholecalciferol (VITAMIN D3) 2000 units capsule   Oral   Take 1 capsule (2,000 Units total) by mouth daily.   30 capsule   0   . cyanocobalamin (,VITAMIN B-12,) 1000 MCG/ML injection      INJECT 1 ML IM EACH MONTH   30 mL   11   . diltiazem (CARDIZEM CD) 240 MG 24 hr capsule   Oral   Take 1 capsule (240 mg total) by mouth once.   90 capsule   3   . furosemide (LASIX) 20 MG tablet   Oral   Take by mouth.         . levothyroxine (SYNTHROID, LEVOTHROID) 75 MCG tablet   Oral   Take 1.5 tablets (112.5 mcg total) by mouth daily before breakfast.   90 tablet   2   . meloxicam (  MOBIC) 15 MG tablet   Oral   Take 15 mg by mouth daily.         . mirtazapine (REMERON) 7.5 MG tablet      TAKE ONE TABLET BY MOUTH AT BEDTIME   30 tablet   5     FOR NEXT FILL. THANK YOU   . omeprazole (PRILOSEC) 20 MG capsule      TAKE 1 CAPSULE BY MOUTH EVERY DAY   90 capsule   2     FOR NEXT FILL. THANK YOU   . simvastatin (ZOCOR) 40 MG tablet      TAKE ONE TABLET BY MOUTH DAILY   30 tablet   5     FOR NEXT FILL. THANK YOU     Allergies Tramadol  Family History  Problem Relation Age of Onset  . Lung disease Sister     Social History Social History  Substance Use Topics  . Smoking status: Former Smoker    Types: Cigarettes    Quit date: 06/09/1987  . Smokeless tobacco: Never Used  . Alcohol Use: 0.6 oz/week    1 Glasses of wine per week     Comment: daily glass of  wine or 2    Review of Systems  Constitutional: Negative for fever. Eyes: Negative for visual changes. ENT: Negative for sore throat. Cardiovascular: Negative for chest pain. Respiratory: Negative for shortness of breath. Gastrointestinal: Negative for abdominal pain, vomiting or diarrhea. Genitourinary: Negative for dysuria. Musculoskeletal: Negative for back pain. Skin: Negative for rash. Neurological: Negative for headaches, weakness or numbness. + aphasia and RUE weakness  ____________________________________________   PHYSICAL EXAM:  VITAL SIGNS: Filed Vitals:   03/21/16 1414 03/21/16 1426  BP: 141/81   Pulse: 95   Temp: 97.6 F (36.4 C) 97.9 F (36.6 C)  Resp: 16     Constitutional: Alert and oriented. Well appearing and in no apparent distress. HEENT:      Head: Normocephalic and atraumatic.         Eyes: Conjunctivae are normal. Sclera is non-icteric. EOMI. PERRL      Mouth/Throat: Mucous membranes are moist.       Neck: Supple with no signs of meningismus. Cardiovascular: Regular rate and rhythm. No murmurs, gallops, or rubs. 2+ symmetrical distal pulses are present in all extremities. No JVD. Respiratory: Normal respiratory effort. Lungs are clear to auscultation bilaterally. No wheezes, crackles, or rhonchi.  Gastrointestinal: Soft, non tender, and non distended with positive bowel sounds. No rebound or guarding. Genitourinary: No CVA tenderness. Musculoskeletal: Nontender with normal range of motion in all extremities. No edema, cyanosis, or erythema of extremities. Neurologic: A & O x3, PERRL, no nystagmus, R nasolabial fold asymmetry otherwise CN II-XII intact, motor testing reveals good tone and bulk throughout. There is no evidence of pronator drift or dysmetria. Muscle strength is 5/5 throughout. Deep tendon reflexes are 2+ throughout with downgoing toes. Sensory examination is intact. Gait deferred. Mild dysarthria. Skin: Skin is warm, dry and intact. No  rash noted. Psychiatric: Mood and affect are normal. Speech and behavior are normal.  ____________________________________________   LABS (all labs ordered are listed, but only abnormal results are displayed)  Labs Reviewed  GLUCOSE, CAPILLARY - Abnormal; Notable for the following:    Glucose-Capillary 124 (*)    All other components within normal limits  PROTIME-INR  APTT  CBC  DIFFERENTIAL  COMPREHENSIVE METABOLIC PANEL  TROPONIN I  CBG MONITORING, ED   ____________________________________________  EKG  ED ECG REPORT I,  Nita Sicklearolina Aleera Gilcrease, the attending physician, personally viewed and interpreted this ECG.  Atrial fibrillation, rate of 89, normal QRS and QTc intervals, left axis deviation, no ST elevations or depressions. ____________________________________________  RADIOLOGY  Head CT: Negative ____________________________________________   PROCEDURES  Procedure(s) performed: None Critical Care performed:  None ____________________________________________   INITIAL IMPRESSION / ASSESSMENT AND PLAN / ED COURSE  82 y.o. female h/o SDH, HTN, HLD, afib who presents as a code stroke. Last seen normal at 1 PM. Patient had difficulty speaking and writting. NIHHS at arrival of 2. Code stroke called. Neurology arrived at the bedside during my evaluation.  NIH Stroke Scale  Interval: Baseline Time: 2:40 PM Person Administering Scale: [place]  Administer stroke scale items in the order listed. Record performance in each category after each subscale exam. Do not go back and change scores. Follow directions provided for each exam technique. Scores should reflect what the patient does, not what the clinician thinks the patient can do. The clinician should record answers while administering the exam and work quickly. Except where indicated, the patient should not be coached (i.e., repeated requests to patient to make a special effort).   1a  Level of  consciousness: 0=alert; keenly responsive  1b. LOC questions:  0=Performs both tasks correctly  1c. LOC commands: 0=Performs both tasks correctly  2.  Best Gaze: 0=normal  3.  Visual: 0=No visual loss  4. Facial Palsy: 1=Minor paralysis (flattened nasolabial fold, asymmetric on smiling)  5a.  Motor left arm: 0=No drift, limb holds 90 (or 45) degrees for full 10 seconds  5b.  Motor right arm: 0=No drift, limb holds 90 (or 45) degrees for full 10 seconds  6a. motor left leg: 0=No drift, limb holds 90 (or 45) degrees for full 10 seconds  6b  Motor right leg:  0=No drift, limb holds 90 (or 45) degrees for full 10 seconds  7. Limb Ataxia: 0=Absent  8.  Sensory: 0=Normal; no sensory loss  9. Best Language:  0=No aphasia, normal  10. Dysarthria: 1=Mild to moderate, patient slurs at least some words and at worst, can be understood with some difficulty  11. Extinction and Inattention: 0=No abnormality  12. Distal motor function: 0=Normal   Total:   2    _________________________ 2:39 PM on 03/21/2016 -----------------------------------------  CT head negative for bleed. Patient was given a full dose of aspirin. Patient was evaluated by neurology who recommended admission to the hospital but no treatment at this time. Discussed with the hospitalist for admission.  Pertinent labs & imaging results that were available during my care of the patient were reviewed by me and considered in my medical decision making (see chart for details).    ____________________________________________   FINAL CLINICAL IMPRESSION(S) / ED DIAGNOSES  Final diagnoses:  Transient cerebral ischemia, unspecified transient cerebral ischemia type      NEW MEDICATIONS STARTED DURING THIS VISIT:  New Prescriptions   No medications on file     Note:  This document was prepared using Dragon voice recognition software and may include unintentional dictation errors.    Nita Sicklearolina Yury Schaus, MD 03/21/16 1446

## 2016-03-21 NOTE — Consult Note (Signed)
Referring Physician: Don Perking    Chief Complaint: Difficulty with speech, facial droop  HPI: Elizabeth Horne is an 82 y.o. female with a history of atrial fibrillation on ASA who was at her weekly hair appointment today.  Not long after the start of her services, he raid was alerted that the patient was confused and unable to talk.  Patient was unable to write at that time as well.  EMS  Was called and the patient was brought in for evaluation.  Initial NIHSS of 2.   Patient with a history of dementia.  Has an aid for a few hours a day but otherwise functions independently.    Date last known well: 03/21/2016 Time last known well: Time: 13:00 tPA Given: No: Mild symptoms, h/o craniotomy for SDH  Past Medical History  Diagnosis Date  . Arthritis   . Hypertension   . Hyperlipidemia   . Thyroid disease   . A-fib [facility])     Past Surgical History  Procedure Laterality Date  . Tonsillectomy and adenoidectomy    . Total hip arthroplasty Bilateral   . Thyroidectomy, partial      benign  . Total shoulder arthroplasty Bilateral   . Kyphoplasty  2015    x 2, Dr. Rosita Kea  . Abdominal hysterectomy      menorrhagia  . Vaginal delivery      2  . Joint replacement    . Craniotomy      Family History  Problem Relation Age of Onset  . Lung disease Sister    Social History:  reports that she quit smoking about 28 years ago. Her smoking use included Cigarettes. She has never used smokeless tobacco. She reports that she drinks about 0.6 oz of alcohol per week. She reports that she does not use illicit drugs.  Allergies:  Allergies  Allergen Reactions  . Tramadol Nausea Only    Medications: I have reviewed the patient's current medications. Prior to Admission:  Prior to Admission medications   Medication Sig Start Date End Date Taking? Authorizing Provider  aspirin 81 MG tablet Take 81 mg by mouth daily.    Historical Provider, MD  cetirizine (ZYRTEC) 10 MG tablet Take 10 mg by mouth  daily as needed for allergies or rhinitis.     Historical Provider, MD  Cholecalciferol (VITAMIN D3) 2000 units capsule Take 1 capsule (2,000 Units total) by mouth daily. 09/08/15   Shelia Media, MD  cyanocobalamin (,VITAMIN B-12,) 1000 MCG/ML injection INJECT 1 ML IM EACH MONTH 09/07/14   Shelia Media, MD  diltiazem (CARDIZEM CD) 240 MG 24 hr capsule Take 1 capsule (240 mg total) by mouth once. 10/19/15   Shelia Media, MD  furosemide (LASIX) 20 MG tablet Take by mouth. 08/31/15 08/30/16  Historical Provider, MD  levothyroxine (SYNTHROID, LEVOTHROID) 75 MCG tablet Take 1.5 tablets (112.5 mcg total) by mouth daily before breakfast. 03/12/16   Shelia Media, MD  meloxicam (MOBIC) 15 MG tablet Take 15 mg by mouth daily.    Historical Provider, MD  mirtazapine (REMERON) 7.5 MG tablet TAKE ONE TABLET BY MOUTH AT BEDTIME 11/24/15   Shelia Media, MD  omeprazole (PRILOSEC) 20 MG capsule TAKE 1 CAPSULE BY MOUTH EVERY DAY 01/25/16   Shelia Media, MD  simvastatin (ZOCOR) 40 MG tablet TAKE ONE TABLET BY MOUTH DAILY 11/24/15   Shelia Media, MD    ROS: History obtained from the patient  General ROS: negative for - chills, fatigue, fever, night  sweats, weight gain or weight loss Psychological ROS: memory difficulties Ophthalmic ROS: negative for - blurry vision, double vision, eye pain or loss of vision ENT ROS: negative for - epistaxis, nasal discharge, oral lesions, sore throat, tinnitus or vertigo Allergy and Immunology ROS: negative for - hives or itchy/watery eyes Hematological and Lymphatic ROS: negative for - bleeding problems, bruising or swollen lymph nodes Endocrine ROS: negative for - galactorrhea, hair pattern changes, polydipsia/polyuria or temperature intolerance Respiratory ROS: negative for - cough, hemoptysis, shortness of breath or wheezing Cardiovascular ROS: negative for - chest pain, dyspnea on exertion, edema or irregular heartbeat Gastrointestinal ROS:  negative for - abdominal pain, diarrhea, hematemesis, nausea/vomiting or stool incontinence Genito-Urinary ROS: negative for - dysuria, hematuria, incontinence or urinary frequency/urgency Musculoskeletal ROS: BLE pain Neurological ROS: as noted in HPI Dermatological ROS: negative for rash and skin lesion changes  Physical Examination: Blood pressure 141/81, pulse 95, temperature 97.6 F (36.4 C), temperature source Oral, resp. rate 16, height 5' (1.524 m), weight 48.5 kg (106 lb 14.8 oz), SpO2 96 %.  HEENT-  Normocephalic, no lesions, without obvious abnormality.  Normal external eye and conjunctiva.  Normal TM's bilaterally.  Normal auditory canals and external ears. Normal external nose, mucus membranes and septum.  Normal pharynx. Cardiovascular- S1, S2 normal, pulses palpable throughout   Lungs- chest clear, no wheezing, rales, normal symmetric air entry Abdomen- soft, non-tender; bowel sounds normal; no masses,  no organomegaly Extremities- no edema Lymph-no adenopathy palpable Musculoskeletal-lower extremity tenderness to touch Skin-warm and dry, no hyperpigmentation, vitiligo, or suspicious lesions  Neurological Examination Mental Status: Alert, oriented, thought content appropriate.  Speech fluent without evidence of aphasia but dysarthric.  Able to follow 3 step commands without difficulty. Cranial Nerves: II: Discs flat bilaterally; Visual fields grossly normal, pupils equal, round, reactive to light and accommodation III,IV, VI: ptosis not present, extra-ocular motions intact bilaterally V,VII: decreased right NLF, facial light touch sensation normal bilaterally VIII: hearing normal bilaterally IX,X: gag reflex present XI: bilateral shoulder shrug XII: midline tongue extension Motor: Right : Upper extremity   5/5    Left:     Upper extremity   5/5  Lower extremity   5/5     Lower extremity   5/5 Tone and bulk:normal tone throughout; no atrophy noted Sensory: Pinprick and  light touch intact throughout, bilaterally Deep Tendon Reflexes: 1+ and symmetric with absent AJ's bilaterally Plantars: Right: upgoing   Left: upgoing Cerebellar: Normal finger-to-nose testing bilaterally Gait: not tested due to safety concerns   Laboratory Studies:  Basic Metabolic Panel: No results for input(s): NA, K, CL, CO2, GLUCOSE, BUN, CREATININE, CALCIUM, MG, PHOS in the last 168 hours.  Liver Function Tests: No results for input(s): AST, ALT, ALKPHOS, BILITOT, PROT, ALBUMIN in the last 168 hours. No results for input(s): LIPASE, AMYLASE in the last 168 hours. No results for input(s): AMMONIA in the last 168 hours.  CBC:  Recent Labs Lab 03/21/16 1354  WBC 8.2  NEUTROABS 6.2  HGB 14.7  HCT 43.9  MCV 96.0  PLT 214    Cardiac Enzymes: No results for input(s): CKTOTAL, CKMB, CKMBINDEX, TROPONINI in the last 168 hours.  BNP: Invalid input(s): POCBNP  CBG:  Recent Labs Lab 03/21/16 1405  GLUCAP 124*    Microbiology: Results for orders placed or performed in visit on 12/18/11  Urine culture     Status: None   Collection Time: 12/18/11  9:10 PM  Result Value Ref Range Status   Micro Text Report  Final       SOURCE: CLEAN CATCH    COMMENT                   NO GROWTH IN 36 HOURS   ANTIBIOTIC                                                        Coagulation Studies: No results for input(s): LABPROT, INR in the last 72 hours.  Urinalysis: No results for input(s): COLORURINE, LABSPEC, PHURINE, GLUCOSEU, HGBUR, BILIRUBINUR, KETONESUR, PROTEINUR, UROBILINOGEN, NITRITE, LEUKOCYTESUR in the last 168 hours.  Invalid input(s): APPERANCEUR  Lipid Panel:    Component Value Date/Time   CHOL 155 06/08/2014 1501   TRIG 49.0 06/08/2014 1501   HDL 76.70 06/08/2014 1501   CHOLHDL 2 06/08/2014 1501   VLDL 9.8 06/08/2014 1501   LDLCALC 69 06/08/2014 1501    HgbA1C: No results found for: HGBA1C  Urine Drug Screen:  No results found for: LABOPIA,  COCAINSCRNUR, LABBENZ, AMPHETMU, THCU, LABBARB  Alcohol Level: No results for input(s): ETH in the last 168 hours.   Imaging: Ct Head Code Stroke W/o Cm  03/21/2016  CLINICAL DATA:  Acute onset slurred speech. Right-sided facial droop. EXAM: CT HEAD WITHOUT CONTRAST TECHNIQUE: Contiguous axial images were obtained from the base of the skull through the vertex without intravenous contrast. COMPARISON:  Head CT 10/24/2013 FINDINGS: Brain parenchyma: There is periventricular hypoattenuation compatible with chronic microvascular disease. Cortical gray-white differentiation is preserved. The basal ganglia are normal. Insular ribbons are preserved. No intraparenchymal hemorrhage or evidence of acute cortical infarct. No mass lesion or midline shift. Ventricles, sulci and extra-axial spaces: Normal for age. No extra-axial collection. Paranasal sinuses and mastoids: Normal. Visualized orbits: Normal. Skull and extracranial soft tissues: Old left frontal craniotomy defect. IMPRESSION: No acute hemorrhage or evidence of acute cortical infarct. These results were called by telephone at the time of interpretation on 03/21/2016 at 2:09 pm to Dr. Nita SickleAROLINA VERONESE , who verbally acknowledged these results. Electronically Signed   By: Deatra RobinsonKevin  Herman M.D.   On: 03/21/2016 14:11    Assessment: 82 y.o. female with a history of atrial fibrillation on ASA who presents with slurred speech and mild right facial droop.  NIHSS of 2.  Due to nondisabling presentation patient not considered a tPA candidate at this time.  Although with history of atrial fibrillation patient not a anticoagulation candidate due to fall risk and history of SDH.  Head CT personally reviewed and shows no acute changes.  Further work up recommended.    Stroke Risk Factors - atrial fibrillation, hyperlipidemia and hypertension  Plan: 1. HgbA1c, fasting lipid panel 2. MRI, MRA  of the brain without contrast 3. PT consult, OT consult, Speech consult 4.  Echocardiogram 5. Carotid dopplers 6. Prophylactic therapy-Continue ASA 7. NPO until RN stroke swallow screen 8. Telemetry monitoring 9. Frequent neuro checks  Case discussed with Dr. Cheri RousVeronese  Tory Septer, MD Neurology [phone] 03/21/2016, 2:23 PM

## 2016-03-21 NOTE — Progress Notes (Signed)
Pastoral Care provided with emotional comfort.

## 2016-03-21 NOTE — Telephone Encounter (Signed)
Ok. Left pt a msg to call and sch B12. Thank you!

## 2016-03-21 NOTE — ED Notes (Signed)
Pt transported to CT ?

## 2016-03-21 NOTE — Telephone Encounter (Signed)
Yes please schedule for a monthly one due after 04/08/16 on nurse schedule. thanks

## 2016-03-21 NOTE — ED Notes (Signed)
Floor RN notified during report pt is in ultrasound at this time and will be transported to room afterwards.

## 2016-03-22 ENCOUNTER — Inpatient Hospital Stay: Payer: Medicare Other

## 2016-03-22 DIAGNOSIS — I639 Cerebral infarction, unspecified: Secondary | ICD-10-CM

## 2016-03-22 LAB — LIPID PANEL
CHOL/HDL RATIO: 2 ratio
Cholesterol: 136 mg/dL (ref 0–200)
HDL: 67 mg/dL (ref >40)
LDL CALC: 57 mg/dL (ref 0–99)
TRIGLYCERIDES: 58 mg/dL (ref <150)
VLDL: 12 mg/dL (ref 0–40)

## 2016-03-22 LAB — HEMOGLOBIN A1C: Hgb A1c MFr Bld: 6.2 % — ABNORMAL HIGH (ref 4.0–6.0)

## 2016-03-22 LAB — ECHOCARDIOGRAM COMPLETE
HEIGHTINCHES: 60 in
WEIGHTICAEL: 1518.4 [oz_av]

## 2016-03-22 MED ORDER — CLOPIDOGREL BISULFATE 75 MG PO TABS
75.0000 mg | ORAL_TABLET | Freq: Every day | ORAL | Status: DC
  Administered 2016-03-22 – 2016-03-23 (×2): 75 mg via ORAL
  Filled 2016-03-22 (×2): qty 1

## 2016-03-22 MED ORDER — ATORVASTATIN CALCIUM 20 MG PO TABS: 20.0000 mg | ORAL_TABLET | Freq: Every day | ORAL | Status: DC

## 2016-03-22 MED ORDER — CETYLPYRIDINIUM CHLORIDE 0.05 % MT LIQD
7.0000 mL | Freq: Two times a day (BID) | OROMUCOSAL | Status: DC
Start: 2016-03-22 — End: 2016-03-23
  Administered 2016-03-22: 7 mL via OROMUCOSAL

## 2016-03-22 MED ORDER — ATORVASTATIN CALCIUM 20 MG PO TABS
40.0000 mg | ORAL_TABLET | Freq: Every day | ORAL | Status: DC
  Administered 2016-03-22: 40 mg via ORAL
  Filled 2016-03-22: qty 2

## 2016-03-22 NOTE — Care Management (Signed)
Ad[facility] with the diagnosis of CVA. Son is Raiford NobleRick [phone]). Last seen Dr. Ronna PolioJennifer Walker 03/08/16. Also sees Dr. Birdie SonsSonnenberg. Good family support. Physical therapy evaluation completed. No follow-up recommendations. Gwenette GreetBrenda S Marvalene Barrett RN MSN CCM Care Management [phone]

## 2016-03-22 NOTE — Progress Notes (Signed)
Subjective: No new neurological complaints.  Speech improved.    Objective: Current vital signs: BP 131/74 mmHg  Pulse 90  Temp(Src) 97.5 F (36.4 C) (Oral)  Resp 20  Ht 5' (1.524 m)  Wt 43.046 kg (94 lb 14.4 oz)  BMI 18.53 kg/m2  SpO2 96% Vital signs in last 24 hours: Temp:  [97.4 F (36.3 C)-97.9 F (36.6 C)] 97.5 F (36.4 C) (07/20 1000) Pulse Rate:  [68-117] 90 (07/20 1000) Resp:  [14-24] 20 (07/20 1000) BP: (93-150)/(65-104) 131/74 mmHg (07/20 1000) SpO2:  [90 %-96 %] 96 % (07/20 1000) FiO2 (%):  [21 %] 21 % (07/19 1519) Weight:  [43.046 kg (94 lb 14.4 oz)-48.5 kg (106 lb 14.8 oz)] 43.046 kg (94 lb 14.4 oz) (07/19 1804)  Intake/Output from previous day: 07/19 0701 - 07/20 0700 In: 120 [P.O.:120] Out: -  Intake/Output this shift:   Nutritional status: Diet regular Room service appropriate?: Yes; Fluid consistency:: Thin  Neurologic Exam: Mental Status: Alert, oriented, thought content appropriate. Speech fluent with some paraphasic errors noted at times. Able to follow 3 step commands without difficulty. Cranial Nerves: II: Discs flat bilaterally; Visual fields grossly normal, pupils equal, round, reactive to light and accommodation III,IV, VI: ptosis not present, extra-ocular motions intact bilaterally V,VII: improved right facial droop, facial light touch sensation normal bilaterally VIII: hearing normal bilaterally IX,X: gag reflex present XI: bilateral shoulder shrug XII: midline tongue extension Motor: Right :Upper extremity 5/5Left: Upper extremity 5/5 Lower extremity 5/5Lower extremity 5/5 Tone and bulk:normal tone throughout; no atrophy noted Sensory: Pinprick and light touch intact throughout, bilaterally Deep Tendon Reflexes: 1+ and symmetric with absent AJ's bilaterally Plantars: Right: upgoingLeft:  upgoing Cerebellar: Normal finger-to-nose testing bilaterally Gait: not tested due to safety concerns  Lab Results: Basic Metabolic Panel:  Recent Labs Lab 03/21/16 1354  NA 139  K 4.5  CL 100*  CO2 32  GLUCOSE 122*  BUN 43*  CREATININE 1.13*  CALCIUM 9.7    Liver Function Tests:  Recent Labs Lab 03/21/16 1354  AST 27  ALT 12*  ALKPHOS 65  BILITOT 0.7  PROT 7.0  ALBUMIN 4.2   No results for input(s): LIPASE, AMYLASE in the last 168 hours. No results for input(s): AMMONIA in the last 168 hours.  CBC:  Recent Labs Lab 03/21/16 1354  WBC 8.2  NEUTROABS 6.2  HGB 14.7  HCT 43.9  MCV 96.0  PLT 214    Cardiac Enzymes:  Recent Labs Lab 03/21/16 1354  TROPONINI <0.03    Lipid Panel:  Recent Labs Lab 03/22/16 0540  CHOL 136  TRIG 58  HDL 67  CHOLHDL 2.0  VLDL 12  LDLCALC 57    CBG:  Recent Labs Lab 03/21/16 1405  GLUCAP 124*    Microbiology: Results for orders placed or performed in visit on 12/18/11  Urine culture     Status: None   Collection Time: 12/18/11  9:10 PM  Result Value Ref Range Status   Micro Text Report   Final       SOURCE: CLEAN CATCH    COMMENT                   NO GROWTH IN 36 HOURS   ANTIBIOTIC  Coagulation Studies:  Recent Labs  03/21/16 1354  LABPROT 13.3  INR 0.99    Imaging: Mr Brain Wo Contrast  03/22/2016  CLINICAL DATA:  Slurred speech and right-sided facial droop. History of atrial fibrillation, hypertension, and hyperlipidemia. Prior subdural hematoma. EXAM: MRI HEAD WITHOUT CONTRAST MRA HEAD WITHOUT CONTRAST TECHNIQUE: Multiplanar, multiecho pulse sequences of the brain and surrounding structures were obtained without intravenous contrast. Angiographic images of the head were obtained using MRA technique without contrast. COMPARISON:  Head CT 03/21/2016 FINDINGS: MRI HEAD FINDINGS There are multiple small foci of acute cortical and  subcortical infarction in the left MCA territory involving the posterior left frontal lobe, with the largest focus measuring 10 mm in the operculum. There is no evidence of associated hemorrhage. There is mild cerebral atrophy, with asymmetric enlargement of the left sylvian fissure. Bilateral cerebral white matter T2 hyperintensities are greatest in the periventricular regions and are nonspecific but compatible with mild chronic small vessel ischemic disease. A chronic microhemorrhage is noted in the anterior right insula. No mass, midline shift, or extra-axial fluid collection is seen. Prior bilateral cataract extraction is noted. No significant inflammatory disease is seen in the paranasal sinuses are mastoid air cells. Major intracranial vascular flow voids are preserved. MRA HEAD FINDINGS The study is mildly motion degraded. The visualized distal left vertebral artery is widely patent and dominant. The right vertebral artery is hypoplastic and functionally terminates in PICA. Left PICA is patent. AICA and SCA origins are patent. Basilar artery is widely patent and mildly tortuous. There is a fetal type origin of the right PCA. Evaluation of the P2 segments is limited bilaterally by motion artifact as well as artifact near the slab interface, however there is no evidence of flow limiting proximal P2 stenosis. The internal carotid arteries are patent from skullbase to carotid termini. The cavernous segments are mildly ectatic bilaterally. A1 and M1 segments are patent without evidence of significant stenosis. Branch vessel evaluation is mildly limited by motion artifact and artifact at the slab interface, however there is a decreased number of small MCA branch vessels on the right compared to the left with with a discrete small branch vessel occlusion anteriorly in the sylvian fissure. No intracranial aneurysm is identified. IMPRESSION: 1. Small acute left MCA infarcts in the frontal lobe. 2. Mild chronic small  vessel ischemic disease. 3. No large vessel occlusion or flow limiting proximal stenosis. Decreased number of right MCA branch vessels. Electronically Signed   By: Sebastian Ache M.D.   On: 03/22/2016 10:15   US Carotid Bilateral  03/21/2016  CLINICAL DATA:  82 year old female with a history of cerebral vascular accident. Cardiovascular risk factors include hypertension, known stroke/TIA, hyperlipidemia, diabetes EXAM: BILATERAL CAROTID DUPLEX ULTRASOUND TECHNIQUE: Wallace Cullens scale imaging, color Doppler and duplex ultrasound were performed of bilateral carotid and vertebral arteries in the neck. COMPARISON:  No prior duplex FINDINGS: Criteria: Quantification of carotid stenosis is based on velocity parameters that correlate the residual internal carotid diameter with NASCET-based stenosis levels, using the diameter of the distal internal carotid lumen as the denominator for stenosis measurement. The following velocity measurements were obtained: RIGHT ICA:  Systolic 93 cm/sec, Diastolic 28 cm/sec CCA:  53 cm/sec SYSTOLIC ICA/CCA RATIO:  1.8 ECA:  50 cm/sec LEFT ICA:  Systolic 92 cm/sec, Diastolic 35 cm/sec CCA:  50 cm/sec SYSTOLIC ICA/CCA RATIO:  1.9 ECA:  51 cm/sec Right Brachial SBP: Not acquired Left Brachial SBP: Not acquired RIGHT CAROTID ARTERY: No significant calcified disease of the right common  carotid artery. Intermediate waveform maintained. Heterogeneous plaque without significant calcifications at the right carotid bifurcation. Low resistance waveform of the right ICA. Tortuosity. RIGHT VERTEBRAL ARTERY: Antegrade flow with low resistance waveform. LEFT CAROTID ARTERY: No significant calcified disease of the left common carotid artery. Intermediate waveform maintained. Heterogeneous plaque at the left carotid bifurcation without significant calcifications. Low resistance waveform of the left ICA. LEFT VERTEBRAL ARTERY:  Antegrade flow with low resistance waveform. IMPRESSION: Color duplex indicates moderate  heterogeneous plaque with no hemodynamically significant stenosis by duplex criteria in the extracranial cerebrovascular circulation. Signed, Yvone Neu. Loreta Ave, DO [facility] Electronically Signed   By: Gilmer Mor D.O.   On: 03/21/2016 17:08   Mr Maxine Glenn Head/brain Wo Cm  03/22/2016  CLINICAL DATA:  Slurred speech and right-sided facial droop. History of atrial fibrillation, hypertension, and hyperlipidemia. Prior subdural hematoma. EXAM: MRI HEAD WITHOUT CONTRAST MRA HEAD WITHOUT CONTRAST TECHNIQUE: Multiplanar, multiecho pulse sequences of the brain and surrounding structures were obtained without intravenous contrast. Angiographic images of the head were obtained using MRA technique without contrast. COMPARISON:  Head CT 03/21/2016 FINDINGS: MRI HEAD FINDINGS There are multiple small foci of acute cortical and subcortical infarction in the left MCA territory involving the posterior left frontal lobe, with the largest focus measuring 10 mm in the operculum. There is no evidence of associated hemorrhage. There is mild cerebral atrophy, with asymmetric enlargement of the left sylvian fissure. Bilateral cerebral white matter T2 hyperintensities are greatest in the periventricular regions and are nonspecific but compatible with mild chronic small vessel ischemic disease. A chronic microhemorrhage is noted in the anterior right insula. No mass, midline shift, or extra-axial fluid collection is seen. Prior bilateral cataract extraction is noted. No significant inflammatory disease is seen in the paranasal sinuses are mastoid air cells. Major intracranial vascular flow voids are preserved. MRA HEAD FINDINGS The study is mildly motion degraded. The visualized distal left vertebral artery is widely patent and dominant. The right vertebral artery is hypoplastic and functionally terminates in PICA. Left PICA is patent. AICA and SCA origins are patent. Basilar artery  is widely patent and mildly tortuous. There is a fetal type origin of the right PCA. Evaluation of the P2 segments is limited bilaterally by motion artifact as well as artifact near the slab interface, however there is no evidence of flow limiting proximal P2 stenosis. The internal carotid arteries are patent from skullbase to carotid termini. The cavernous segments are mildly ectatic bilaterally. A1 and M1 segments are patent without evidence of significant stenosis. Branch vessel evaluation is mildly limited by motion artifact and artifact at the slab interface, however there is a decreased number of small MCA branch vessels on the right compared to the left with with a discrete small branch vessel occlusion anteriorly in the sylvian fissure. No intracranial aneurysm is identified. IMPRESSION: 1. Small acute left MCA infarcts in the frontal lobe. 2. Mild chronic small vessel ischemic disease. 3. No large vessel occlusion or flow limiting proximal stenosis. Decreased number of right MCA branch vessels. Electronically Signed   By: Sebastian Ache M.D.   On: 03/22/2016 10:15   Ct Head Code Stroke W/o Cm  03/21/2016  CLINICAL DATA:  Acute onset slurred speech. Right-sided facial droop. EXAM: CT HEAD WITHOUT CONTRAST TECHNIQUE: Contiguous axial images were obtained from the base of the skull through the vertex without intravenous contrast. COMPARISON:  Head CT 10/24/2013 FINDINGS: Brain parenchyma: There is periventricular hypoattenuation compatible with chronic microvascular disease.  Cortical gray-white differentiation is preserved. The basal ganglia are normal. Insular ribbons are preserved. No intraparenchymal hemorrhage or evidence of acute cortical infarct. No mass lesion or midline shift. Ventricles, sulci and extra-axial spaces: Normal for age. No extra-axial collection. Paranasal sinuses and mastoids: Normal. Visualized orbits: Normal. Skull and extracranial soft tissues: Old left frontal craniotomy defect.  IMPRESSION: No acute hemorrhage or evidence of acute cortical infarct. These results were called by telephone at the time of interpretation on 03/21/2016 at 2:09 pm to Dr. Nita SickleAROLINA VERONESE , who verbally acknowledged these results. Electronically Signed   By: Deatra RobinsonKevin  Herman M.D.   On: 03/21/2016 14:11    Medications:  I have reviewed the patient's current medications. Scheduled: . antiseptic oral rinse  7 mL Mouth Rinse BID  . aspirin EC  81 mg Oral Daily  . atorvastatin  20 mg Oral q1800  . cholecalciferol  2,000 Units Oral Daily  . diltiazem  240 mg Oral Daily  . enoxaparin (LOVENOX) injection  30 mg Subcutaneous Q24H  . furosemide  20 mg Oral Daily  . levothyroxine  100 mcg Oral QAC breakfast  . loratadine  10 mg Oral Daily  . meloxicam  15 mg Oral Daily  . mirtazapine  7.5 mg Oral QHS  . pantoprazole  40 mg Oral Daily    Assessment/Plan: Patient improved but not yet back to baseline.  Conversation with son and patient today.  MRI of the brain personally reviewed and shows small acute left MCA infarcts, likely embolic.  MRA unremarkable.  Carotid dopplers show some moderate stenosis.  Echocardiogram shows no cardiac source of emboli with an EF of 55-60%.  A1c pending, LDL 57.  Recommendations: 1.  ASA 81mg , Plavix 75mg  daily 2.  Therapy evaluations to occur today 3.  Patient to folow up with neurology on an outpatient basis.     LOS: 1 day   Thana FarrLeslie Helmut Hennon, MD Neurology [phone] 03/22/2016  12:07 PM

## 2016-03-22 NOTE — Progress Notes (Signed)
Simvastatin 40 mg po daily changed to atorvastatin 20 mg po daily to avoid drug-drug interaction with diltiazem.   Onisha Cedeno A. Sun[place]son, [place].D., BCPS Clinical Pharmacist 03/22/2016 [phone]

## 2016-03-22 NOTE — Progress Notes (Signed)
[facility] [place] at [facility]   PATIENT NAME: Elizabeth Horne    MRN#:  [id number]  DATE OF BIRTH:  April 25, 1926  SUBJECTIVE:  Hospital Day: 1 day Elizabeth Horne is a 82 y.o. female presenting with Code Stroke .   Overnight events: No overnight events Interval Events: Speech improved, family at bedside currently patient without complaints  REVIEW OF SYSTEMS:  CONSTITUTIONAL: No fever, fatigue or weakness.  EYES: No blurred or double vision.  EARS, NOSE, AND THROAT: No tinnitus or ear pain.  RESPIRATORY: No cough, shortness of breath, wheezing or hemoptysis.  CARDIOVASCULAR: No chest pain, orthopnea, edema.  GASTROINTESTINAL: No nausea, vomiting, diarrhea or abdominal pain.  GENITOURINARY: No dysuria, hematuria.  ENDOCRINE: No polyuria, nocturia,  HEMATOLOGY: No anemia, easy bruising or bleeding SKIN: No rash or lesion. MUSCULOSKELETAL: No joint pain or arthritis.   NEUROLOGIC: No tingling, numbness, weakness.  PSYCHIATRY: No anxiety or depression.   DRUG ALLERGIES:   Allergies  Allergen Reactions  . Tramadol Nausea Only    VITALS:  Blood pressure 131/74, pulse 90, temperature 97.5 F (36.4 C), temperature source Oral, resp. rate 20, height 5' (1.524 m), weight 94 lb 14.4 oz (43.046 kg), SpO2 96 %.  PHYSICAL EXAMINATION:  VITAL SIGNS: Filed Vitals:   03/22/16 0651 03/22/16 1000  BP: 133/84 131/74  Pulse: 79 90  Temp: 97.4 F (36.3 C) 97.5 F (36.4 C)  Resp: 20 20   GENERAL:82 y.o.female currently in no acute distress.  HEAD: Normocephalic, atraumatic.  EYES: Pupils equal, round, reactive to light. Extraocular muscles intact. No scleral icterus.  MOUTH: Moist mucosal membrane. Dentition intact. No abscess noted.  EAR, NOSE, THROAT: Clear without exudates. No external lesions.  NECK: Supple. No thyromegaly. No nodules. No JVD.  PULMONARY: Clear to ascultation, without wheeze rails or rhonci. No use of accessory muscles, Good respiratory  effort. good air entry bilaterally CHEST: Nontender to palpation.  CARDIOVASCULAR: S1 and S2. iregular rate and rhythm. No murmurs, rubs, or gallops. No edema. Pedal pulses 2+ bilaterally.  GASTROINTESTINAL: Soft, nontender, nondistended. No masses. Positive bowel sounds. No hepatosplenomegaly.  MUSCULOSKELETAL: No swelling, clubbing, or edema. Range of motion full in all extremities.  NEUROLOGIC: Cranial nerves II through XII are intact. No gross focal neurological deficits. Sensation intact. Reflexes intact.  SKIN: No ulceration, lesions, rashes, or cyanosis. Skin warm and dry. Turgor intact.  PSYCHIATRIC: Mood, affect within normal limits. The patient is awake, alert and oriented x 3. Insight, judgment intact.      LABORATORY PANEL:   CBC  Recent Labs Lab 03/21/16 1354  WBC 8.2  HGB 14.7  HCT 43.9  PLT 214   ------------------------------------------------------------------------------------------------------------------  Chemistries   Recent Labs Lab 03/21/16 1354  NA 139  K 4.5  CL 100*  CO2 32  GLUCOSE 122*  BUN 43*  CREATININE 1.13*  CALCIUM 9.7  AST 27  ALT 12*  ALKPHOS 65  BILITOT 0.7   ------------------------------------------------------------------------------------------------------------------  Cardiac Enzymes  Recent Labs Lab 03/21/16 1354  TROPONINI <0.03   ------------------------------------------------------------------------------------------------------------------  RADIOLOGY:  Mr Brain Wo Contrast  03/22/2016  CLINICAL DATA:  Slurred speech and right-sided facial droop. History of atrial fibrillation, hypertension, and hyperlipidemia. Prior subdural hematoma. EXAM: MRI HEAD WITHOUT CONTRAST MRA HEAD WITHOUT CONTRAST TECHNIQUE: Multiplanar, multiecho pulse sequences of the brain and surrounding structures were obtained without intravenous contrast. Angiographic images of the head were obtained using MRA technique without contrast.  COMPARISON:  Head CT 03/21/2016 FINDINGS: MRI HEAD FINDINGS There are multiple small  foci of acute cortical and subcortical infarction in the left MCA territory involving the posterior left frontal lobe, with the largest focus measuring 10 mm in the operculum. There is no evidence of associated hemorrhage. There is mild cerebral atrophy, with asymmetric enlargement of the left sylvian fissure. Bilateral cerebral white matter T2 hyperintensities are greatest in the periventricular regions and are nonspecific but compatible with mild chronic small vessel ischemic disease. A chronic microhemorrhage is noted in the anterior right insula. No mass, midline shift, or extra-axial fluid collection is seen. Prior bilateral cataract extraction is noted. No significant inflammatory disease is seen in the paranasal sinuses are mastoid air cells. Major intracranial vascular flow voids are preserved. MRA HEAD FINDINGS The study is mildly motion degraded. The visualized distal left vertebral artery is widely patent and dominant. The right vertebral artery is hypoplastic and functionally terminates in PICA. Left PICA is patent. AICA and SCA origins are patent. Basilar artery is widely patent and mildly tortuous. There is a fetal type origin of the right PCA. Evaluation of the P2 segments is limited bilaterally by motion artifact as well as artifact near the slab interface, however there is no evidence of flow limiting proximal P2 stenosis. The internal carotid arteries are patent from skullbase to carotid termini. The cavernous segments are mildly ectatic bilaterally. A1 and M1 segments are patent without evidence of significant stenosis. Branch vessel evaluation is mildly limited by motion artifact and artifact at the slab interface, however there is a decreased number of small MCA branch vessels on the right compared to the left with with a discrete small branch vessel occlusion anteriorly in the sylvian fissure. No intracranial  aneurysm is identified. IMPRESSION: 1. Small acute left MCA infarcts in the frontal lobe. 2. Mild chronic small vessel ischemic disease. 3. No large vessel occlusion or flow limiting proximal stenosis. Decreased number of right MCA branch vessels. Electronically Signed   By: Sebastian Ache M.D.   On: 03/22/2016 10:15   US Carotid Bilateral  03/21/2016  CLINICAL DATA:  82 year old female with a history of cerebral vascular accident. Cardiovascular risk factors include hypertension, known stroke/TIA, hyperlipidemia, diabetes EXAM: BILATERAL CAROTID DUPLEX ULTRASOUND TECHNIQUE: Wallace Cullens scale imaging, color Doppler and duplex ultrasound were performed of bilateral carotid and vertebral arteries in the neck. COMPARISON:  No prior duplex FINDINGS: Criteria: Quantification of carotid stenosis is based on velocity parameters that correlate the residual internal carotid diameter with NASCET-based stenosis levels, using the diameter of the distal internal carotid lumen as the denominator for stenosis measurement. The following velocity measurements were obtained: RIGHT ICA:  Systolic 93 cm/sec, Diastolic 28 cm/sec CCA:  53 cm/sec SYSTOLIC ICA/CCA RATIO:  1.8 ECA:  50 cm/sec LEFT ICA:  Systolic 92 cm/sec, Diastolic 35 cm/sec CCA:  50 cm/sec SYSTOLIC ICA/CCA RATIO:  1.9 ECA:  51 cm/sec Right Brachial SBP: Not acquired Left Brachial SBP: Not acquired RIGHT CAROTID ARTERY: No significant calcified disease of the right common carotid artery. Intermediate waveform maintained. Heterogeneous plaque without significant calcifications at the right carotid bifurcation. Low resistance waveform of the right ICA. Tortuosity. RIGHT VERTEBRAL ARTERY: Antegrade flow with low resistance waveform. LEFT CAROTID ARTERY: No significant calcified disease of the left common carotid artery. Intermediate waveform maintained. Heterogeneous plaque at the left carotid bifurcation without significant calcifications. Low resistance waveform of the left ICA.  LEFT VERTEBRAL ARTERY:  Antegrade flow with low resistance waveform. IMPRESSION: Color duplex indicates moderate heterogeneous plaque with no hemodynamically significant stenosis by duplex criteria in the extracranial cerebrovascular  circulation. Signed, Yvone Neu. Loreta Ave, DO [facility] Electronically Signed   By: Gilmer Mor D.O.   On: 03/21/2016 17:08   Mr Maxine Glenn Head/brain Wo Cm  03/22/2016  CLINICAL DATA:  Slurred speech and right-sided facial droop. History of atrial fibrillation, hypertension, and hyperlipidemia. Prior subdural hematoma. EXAM: MRI HEAD WITHOUT CONTRAST MRA HEAD WITHOUT CONTRAST TECHNIQUE: Multiplanar, multiecho pulse sequences of the brain and surrounding structures were obtained without intravenous contrast. Angiographic images of the head were obtained using MRA technique without contrast. COMPARISON:  Head CT 03/21/2016 FINDINGS: MRI HEAD FINDINGS There are multiple small foci of acute cortical and subcortical infarction in the left MCA territory involving the posterior left frontal lobe, with the largest focus measuring 10 mm in the operculum. There is no evidence of associated hemorrhage. There is mild cerebral atrophy, with asymmetric enlargement of the left sylvian fissure. Bilateral cerebral white matter T2 hyperintensities are greatest in the periventricular regions and are nonspecific but compatible with mild chronic small vessel ischemic disease. A chronic microhemorrhage is noted in the anterior right insula. No mass, midline shift, or extra-axial fluid collection is seen. Prior bilateral cataract extraction is noted. No significant inflammatory disease is seen in the paranasal sinuses are mastoid air cells. Major intracranial vascular flow voids are preserved. MRA HEAD FINDINGS The study is mildly motion degraded. The visualized distal left vertebral artery is widely patent and dominant. The right vertebral artery is  hypoplastic and functionally terminates in PICA. Left PICA is patent. AICA and SCA origins are patent. Basilar artery is widely patent and mildly tortuous. There is a fetal type origin of the right PCA. Evaluation of the P2 segments is limited bilaterally by motion artifact as well as artifact near the slab interface, however there is no evidence of flow limiting proximal P2 stenosis. The internal carotid arteries are patent from skullbase to carotid termini. The cavernous segments are mildly ectatic bilaterally. A1 and M1 segments are patent without evidence of significant stenosis. Branch vessel evaluation is mildly limited by motion artifact and artifact at the slab interface, however there is a decreased number of small MCA branch vessels on the right compared to the left with with a discrete small branch vessel occlusion anteriorly in the sylvian fissure. No intracranial aneurysm is identified. IMPRESSION: 1. Small acute left MCA infarcts in the frontal lobe. 2. Mild chronic small vessel ischemic disease. 3. No large vessel occlusion or flow limiting proximal stenosis. Decreased number of right MCA branch vessels. Electronically Signed   By: Sebastian Ache M.D.   On: 03/22/2016 10:15   Ct Head Code Stroke W/o Cm  03/21/2016  CLINICAL DATA:  Acute onset slurred speech. Right-sided facial droop. EXAM: CT HEAD WITHOUT CONTRAST TECHNIQUE: Contiguous axial images were obtained from the base of the skull through the vertex without intravenous contrast. COMPARISON:  Head CT 10/24/2013 FINDINGS: Brain parenchyma: There is periventricular hypoattenuation compatible with chronic microvascular disease. Cortical gray-white differentiation is preserved. The basal ganglia are normal. Insular ribbons are preserved. No intraparenchymal hemorrhage or evidence of acute cortical infarct. No mass lesion or midline shift. Ventricles, sulci and extra-axial spaces: Normal for age. No extra-axial collection. Paranasal sinuses and  mastoids: Normal. Visualized orbits: Normal. Skull and extracranial soft tissues: Old left frontal craniotomy defect. IMPRESSION: No acute hemorrhage or evidence of acute cortical infarct. These results were called by telephone at the time of interpretation on 03/21/2016 at 2:09 pm to Dr. Nita Sickle , who verbally acknowledged these results.  Electronically Signed   By: Deatra Robinson M.D.   On: 03/21/2016 14:11    EKG:   Orders placed or performed during the hospital encounter of 03/21/16  . ED EKG  . ED EKG  . EKG 12-Lead  . EKG 12-Lead    ASSESSMENT AND PLAN:   Storie Horne is a 82 y.o. female presenting with Code Stroke . Admitted 03/21/2016 : Day #: 1 day  1. Acute CVA: Neurology input appreciated, aspirin and Plavix for risk factor modification remainder of workup finalized  2. Chronic atrial fibrillation: Rate controlled  3. Hypothyroidism unspecified Synthroid  4. Hyperlipidemia unspecified continue therapy with simvastatin  5. Miscellaneous we'll do Lovenox for DVT prophylaxis  Disposition: Physical therapy evaluation suspected home with home health-discharge likely tomorrow  All the records are reviewed and case discussed with Care Management/Social Workerr. Management plans discussed with the patient, family and they are in agreement.  CODE STATUS: full TOTAL TIME TAKING CARE OF THIS PATIENT: 28 minutes.   POSSIBLE D/C IN 1DAYS, DEPENDING ON CLINICAL CONDITION.   Zawadi Aplin,  Mardi Mainland.D on 03/22/2016 at 1:08 PM  Between 7am to 6pm - Pager - [phone]  After 6pm: House Pager: - [phone]  [facility]  Office  [phone]  CC: Primary care physician; Marikay Alar, MD

## 2016-03-22 NOTE — Care Management Important Message (Signed)
Important Message  Patient Details  Name: Elizabeth Horne MRN: [id number] Date of Birth: 07-03-1926   Medicare Important Message Given:  Yes    Gwenette GreetBrenda S Johndaniel Catlin, RN 03/22/2016, 2:23 PM

## 2016-03-22 NOTE — Evaluation (Signed)
Physical Therapy Evaluation Patient Details Name: Elizabeth Horne MRN: [id number] DOB: [birth year] Today's Date: 03/22/2016   History of Present Illness  presented to ER secondary to episode of slurred speech, R facial droop; admitted with acute CVA (small L MCA infarcts in frontal lobe)  Clinical Impression  Upon evaluation, patient alert and oriented; follows all commands and demonstrates fair/good insight and safety awareness.  No focal strength or sensory deficits noted in bilat UE/LEs.  All strength/movement grossly functional for basic transfers and mobility.  Demonstrates ability to complete bed mobility indep; sit/stand, basic transfers and gait (220') with RW, cga/close sup.  Mild higher-level balance deficits noted on BERG assessment (35/56), but improved with use of assist device during all mobility efforts.  Patient/familiy report that patient's performance is generally baseline for her; no significant change in status evident. Will maintain patient on caseload during remaining hospitalization to promote continued mobility and ensure patient remains neurologically intact.  No PT follow up recommended upon discharge at this time.    Follow Up Recommendations No PT follow up (will maintain on caseload during hospitalization to promote continued mobility)    Equipment Recommendations       Recommendations for Other Services       Precautions / Restrictions Precautions Precautions: Fall Restrictions Weight Bearing Restrictions: No      Mobility  Bed Mobility Overal bed mobility: Modified Independent                Transfers Overall transfer level: Needs assistance Equipment used: Rolling walker (2 wheeled) Transfers: Sit to/from Stand Sit to Stand: Min guard;Supervision            Ambulation/Gait Ambulation/Gait assistance: MicrobiologistMin guard;Supervision Ambulation Distance (Feet): 220 Feet Assistive device: Rolling walker (2 wheeled)       General Gait Details:  reciprocal stepping pattern with fair step height/length, fair cadence and gait speed; no overt buckling or LOB  Stairs            Wheelchair Mobility    Modified Rankin (Stroke Patients Only)       Balance Overall balance assessment: Needs assistance Sitting-balance support: No upper extremity supported;Feet supported Sitting balance-Leahy Scale: Good     Standing balance support: No upper extremity supported Standing balance-Leahy Scale: Fair                   Standardized Balance Assessment Standardized Balance Assessment : Berg Balance Test Berg Balance Test Sit to Stand: Able to stand  independently using hands Standing Unsupported: Able to stand 2 minutes with supervision Sitting with Back Unsupported but Feet Supported on Floor or Stool: Able to sit safely and securely 2 minutes Stand to Sit: Sits safely with minimal use of hands Transfers: Able to transfer safely, definite need of hands Standing Unsupported with Eyes Closed: Able to stand 10 seconds with supervision Standing Ubsupported with Feet Together: Able to place feet together independently but unable to hold for 30 seconds From Standing, Reach Forward with Outstretched Arm: Can reach forward >5 cm safely (2") From Standing Position, Pick up Object from Floor: Able to pick up shoe, needs supervision From Standing Position, Turn to Look Behind Over each Shoulder: Looks behind one side only/other side shows less weight shift Turn 360 Degrees: Able to turn 360 degrees safely but slowly Standing Unsupported, Alternately Place Feet on Step/Stool: Able to complete >2 steps/needs minimal assist Standing Unsupported, One Foot in Front: Needs help to step but can hold 15 seconds Standing on One Leg:  Tries to lift leg/unable to hold 3 seconds but remains standing independently Total Score: 35         Pertinent Vitals/Pain Pain Assessment: No/denies pain    Home Living Family/patient expects to be  discharged to:: Private residence Living Arrangements: Alone Available Help at Discharge: Family;Available PRN/intermittently Type of Home: House                Prior Function Level of Independence: Independent with assistive device(s)         Comments: Mod indep for ADLs, household and some community mobility with RW     Hand Dominance        Extremity/Trunk Assessment   Upper Extremity Assessment: Overall WFL for tasks assessed (strength grossly 4+/5, symmetrical; light touch, proprioception and coordination intact; no drift)           Lower Extremity Assessment: Overall WFL for tasks assessed (strength at least 3+/5 (limited ability to rest with resistance secondary to pain in distal LEs); sensation, proprioception and coordination intact)         Communication   Communication: HOH  Cognition Arousal/Alertness: Awake/alert Behavior During Therapy: WFL for tasks assessed/performed Overall Cognitive Status: Within Functional Limits for tasks assessed                      General Comments      Exercises        Assessment/Plan    PT Assessment Patient needs continued PT services  PT Diagnosis Difficulty walking;Generalized weakness   PT Problem List Decreased balance  PT Treatment Interventions Gait training;Stair training;Functional mobility training;Balance training   PT Goals (Current goals can be found in the Care Plan section) Acute Rehab PT Goals Patient Stated Goal: to go back home PT Goal Formulation: With patient/family Time For Goal Achievement: 04/05/16 Potential to Achieve Goals: Good    Frequency Min 2X/week   Barriers to discharge        Co-evaluation               End of Session Equipment Utilized During Treatment: Gait belt Activity Tolerance: Patient tolerated treatment well Patient left: in chair;with call bell/phone within reach;with family/visitor present (patient/family refusing alarm while in room; will  notify nursing if family leaves room) Nurse Communication: Mobility status         Time: 1610-9604 PT Time Calculation (min) (ACUTE ONLY): 24 min   Charges:   PT Evaluation $PT Eval Low Complexity: 1 Procedure PT Treatments $Neuromuscular Re-education: 8-22 mins   PT G Codes:        Mailyn Steichen H. Manson Passey, PT, DPT, NCS 03/22/2016, 4:32 PM [phone]

## 2016-03-22 NOTE — Evaluation (Signed)
Clinical/Bedside Swallow Evaluation Patient Details  Name: Elizabeth Horne MRN: [id number] Date of Birth: [birth year]  Today's Date: 03/22/2016 Time: SLP Start Time (ACUTE ONLY): 1000 SLP Stop Time (ACUTE ONLY): 1100 SLP Time Calculation (min) (ACUTE ONLY): 60 min  Past Medical History:  Past Medical History  Diagnosis Date  . Arthritis   . Hypertension   . Hyperlipidemia   . Thyroid disease   . A-fib (HCC)   . Subdural hematoma [facility])    Past Surgical History:  Past Surgical History  Procedure Laterality Date  . Tonsillectomy and adenoidectomy    . Total hip arthroplasty Bilateral   . Thyroidectomy, partial      benign  . Total shoulder arthroplasty Bilateral   . Kyphoplasty  2015    x 2, Dr. Rosita Kea  . Abdominal hysterectomy      menorrhagia  . Vaginal delivery      2  . Joint replacement    . Craniotomy     HPI:  Pt is a 82 y.o. female with a known history of Atrial fibrillation, history of subdural hematoma 4 years prior, hypertension, hyperlipidemia and hypothyroidism who presents with complaint of slurred speech and right-sided facial droop while she was at the hairdresser. Last known well time was 1300. Patient was seen by Dr. Thad Ranger of neurology and was not given TPA due to her symptoms are very mild as well as history of subdural hematoma. Patient's speech is improved. Her she still has some right-sided facial droop. Denies any weakness of the extremity. Pt denied any language deficits; stated speech was "fine" - family member endorsed that pt's speech was "much improved now from yesterday". Pt conversed w/ SLP in conversation w/ slight decreased precision of certain speech sounds intermittently; intermittent delayed word finding - unsure of pt's baseline communication skills in light of old neurological events. Family member and pt stated she felt at her baseline w/ communication abilitites; denied swallowing deficits w/ breakfast meal this morning.    Assessment / Plan /  Recommendation Clinical Impression  Pt appeared to adequately tolerate trials of thin liquids and purees/soft solids w/ no overt s/s of aspiration noted; no oral phase deficits noted. Pt fed self requiring only min setup assistance. Pt consumed few trials w/ no coughing or change in vocal quality noted. Timely bolus management w/ oral clearing following the swallow. Pt appears at reduced risk for aspiration following general aspiration precautions. Recommend continue w/ current diet w/ tray setup assistance if needed; pills given w/ applesauce for easier swallowing if any difficulty swallowing w/ liquids is noted.      Aspiration Risk   (reduced)    Diet Recommendation  continue w/ Regular diet; thin liquids. General aspiration precautions.  Medication Administration: Whole meds with puree (if easier for swallowing for pt)    Other  Recommendations Recommended Consults:  (dietician if needed) Oral Care Recommendations: Oral care BID;Staff/trained caregiver to provide oral care   Follow up Recommendations  None    Frequency and Duration            Prognosis Prognosis for Safe Diet Advancement: Good      Swallow Study   General Date of Onset: 03/21/16 HPI: Pt is a 82 y.o. female with a known history of Atrial fibrillation, history of subdural hematoma 4 years prior, hypertension, hyperlipidemia and hypothyroidism who presents with complaint of slurred speech and right-sided facial droop while she was at the hairdresser. Last known well time was 1300. Patient was seen by Dr.  Reynolds of neurology and was not given TPA due to her symptoms are very mild as well as history of subdural hematoma. Patient's speech is improved. Her she still has some right-sided facial droop. Denies any weakness of the extremity. Pt denied any language deficits; stated speech was "fine" - family member endorsed that pt's speech was "much improved now from yesterday". Pt conversed w/ SLP in conversation w/ slight  decreased precision of certain speech sounds intermittently; intermittent delayed word finding - unsure of pt's baseline communication skills in light of old neurological events. Family member and pt stated she felt at her baseline w/ communication abilitites; denied swallowing deficits w/ breakfast meal this morning.  Type of Study: Bedside Swallow Evaluation Previous Swallow Assessment: none Diet Prior to this Study: Regular;Thin liquids Temperature Spikes Noted: No Respiratory Status: Room air History of Recent Intubation: No Behavior/Cognition: Alert;Cooperative;Pleasant mood Oral Cavity Assessment: Within Functional Limits Oral Care Completed by SLP: Recent completion by staff Oral Cavity - Dentition: Adequate natural dentition Vision: Functional for self-feeding Self-Feeding Abilities: Able to feed self Patient Positioning: Upright in bed (EOB) Baseline Vocal Quality: Normal;Low vocal intensity Volitional Cough: Strong Volitional Swallow: Able to elicit    Oral/Motor/Sensory Function Overall Oral Motor/Sensory Function: Within functional limits (grossly - no labial/facial droop noted)   Ice Chips Ice chips: Within functional limits Presentation: Spoon (fed; 1 trial)   Thin Liquid Thin Liquid: Within functional limits Presentation: Cup;Self Fed (~2-3 ozs total)    Nectar Thick Nectar Thick Liquid: Not tested   Honey Thick Honey Thick Liquid: Not tested   Puree Puree: Within functional limits Presentation: Self Fed;Spoon (2 trials) Other Comments: declined more   Solid   GO   Solid: Within functional limits Presentation: Self Fed (1 trial) Other Comments: declined more       Elizabeth SomKatherine Lee Kuang, MS, CCC-SLP  Shown Dissinger 03/22/2016,3:29 PM

## 2016-03-22 NOTE — Evaluation (Signed)
  Occupational Therapy Evaluation Patient Details Name: Elizabeth Horne MRN: [id number] DOB: 1926/07/07 Today's Date: 03/22/2016    History of Present Illness Pt. is an 82 y.o. female who [facility] with right facial droop, and slurred speech. Pt. had an acutes CVA. Pt. has a history of a Subdural Hematoma s/p 4 years ago.   Clinical Impression   Pt. Is an 82 y.o. Female who [facility] with [facility]. Pt. Presents with impaired standing balance, impulsivity, and decreased activity tolerances which hinder her ability to complete ADL and IADL skills. Pt. Could benefit from skilled OT services for energy conservation, balance and functional mobility during ADLs/IADLS, and to review home modifications in order to return to her PLOF at her previous living environment.    Follow Up Recommendations  Home health OT    Equipment Recommendations       Recommendations for Other Services PT consult     Precautions / Restrictions Precautions Precautions: Fall Restrictions Weight Bearing Restrictions: No      Mobility Be              Vision     Perception     Praxis      Pertinent Vitals/Pain Pain Assessment: No/denies pain     Hand Dominance Right   Extremity/Trunk Assessment Upper Extremity Assessment Upper Extremity Assessment: Overall WFL for tasks assessed (Bilateral shoulder limitations)          Communication Communication Communication: HOH   Cognition Arousal/Alertness: Awake/alert Behavior During Therapy: WFL for tasks assessed/performed Overall Cognitive Status: Within Functional Limits for tasks assessed                     General Comments       Exercises       Shoulder Instructions      Home Living Family/patient expects to be discharged to:: Private residence Living Arrangements: Alone Available Help at Discharge: Family;Available PRN/intermittently Type of Home: Other(Comment) (condo) Home Access: Stairs to  enter Entrance Stairs-Number of Steps: 6   Home Layout: Two level     Bathroom Shower/Tub: Producer, television/film/videoWalk-in shower   Bathroom Toilet: Standard     Home Equipment: Shower seat - built in;Bedside commode          Prior Functioning/Environment Level of Independence: Independent with assistive device(s)        Comments: Mod indep for ADLs, household and some community mobility with RW    OT Diagnosis: Generalized weakness   OT Problem List: Decreased strength;Decreased knowledge of use of DME or AE;Impaired balance (sitting and/or standing);Decreased activity tolerance   OT Treatment/Interventions: Self-care/ADL training;Therapeutic exercise;Neuromuscular education;DME and/or AE instruction;Patient/family education;Therapeutic activities    OT Goals(Current goals can be found in the care plan section) Acute Rehab OT Goals Patient Stated Goal: To return home  OT Frequency: Min 1X/week   Barriers to D/C:            Co-evaluation              End of Session    Activity Tolerance: Patient tolerated treatment well Patient left: in bed;with call bell/phone within reach;with bed alarm set;with family/visitor present   Time: [id number] OT Time Calculation (min): 30 min Charges:  OT General Charges $OT Visit: 1 Procedure OT Evaluation $OT Eval Moderate Complexity: 1 Procedure G-Codes:    Olegario MessierElaine Rod Majerus, MS, OTR/L Olegario MessierElaine Dontasia Miranda 03/22/2016, 5:42 PM

## 2016-03-22 NOTE — Plan of Care (Signed)
Problem: Self-Care: Goal: Ability to participate in self-care as condition permits will improve Outcome: Progressing Assist given  With this  Problem: Tissue Perfusion: Goal: Cerebral tissue perfusion will improve applicable to all stroke diagnoses  Outcome: Progressing Mri today results showed small acute left mca infarcts frontal lobe. Dr Rebbeca Paulreynalds saw and spoke with pt and family. strated pt on plavix

## 2016-03-23 ENCOUNTER — Telehealth: Payer: Self-pay | Admitting: Family Medicine

## 2016-03-23 ENCOUNTER — Telehealth: Payer: Self-pay | Admitting: *Deleted

## 2016-03-23 MED ORDER — ATORVASTATIN CALCIUM 40 MG PO TABS: 40.0000 mg | ORAL_TABLET | Freq: Every day | ORAL | Status: DC

## 2016-03-23 MED ORDER — CLOPIDOGREL BISULFATE 75 MG PO TABS: 75.0000 mg | ORAL_TABLET | Freq: Every day | ORAL | Status: DC

## 2016-03-23 NOTE — Telephone Encounter (Signed)
Noted. Will follow as appropriate.  

## 2016-03-23 NOTE — Telephone Encounter (Signed)
See note below.  If no availability, I will make patient aware when call is placed for transitional care management. Please let me know.

## 2016-03-23 NOTE — Telephone Encounter (Signed)
Please advise, patient requesting to see Dr. Dan HumphreysWalker, thanks

## 2016-03-23 NOTE — Telephone Encounter (Signed)
I would recommend that she keep the appointment with Dr. Birdie SonsSonnenberg. That way, I can talk with him and we can make a smooth transition while I am still here.

## 2016-03-23 NOTE — Telephone Encounter (Signed)
[facility] on [facility]16 She was admitted due to a stroke.

## 2016-03-23 NOTE — Telephone Encounter (Signed)
Patient notified of the need to keep appointment with Dr. Birdie SonsSonnenberg.

## 2016-03-23 NOTE — Discharge Summary (Signed)
[facility] [place] [facility]   PATIENT NAME: Elizabeth Horne    MR#:  [id number]  DATE OF BIRTH:  Jun 12, 1926  DATE OF ADMISSION:  03/21/2016 ADMITTING PHYSICIAN: Auburn Bilberry, MD  DATE OF DISCHARGE: 03/23/2016  PRIMARY CARE PHYSICIAN: Marikay Alar, MD    ADMISSION DIAGNOSIS:  Slurred speech [R47.81] CVA (cerebral infarction) [I63.9] Transient cerebral ischemia, unspecified transient cerebral ischemia type [G45.9]  DISCHARGE DIAGNOSIS:  Active Problems:   CVA (cerebral infarction)   SECONDARY DIAGNOSIS:   Past Medical History  Diagnosis Date  . Arthritis   . Hypertension   . Hyperlipidemia   . Thyroid disease   . A-fib (HCC)   . Subdural hematoma [facility])     HOSPITAL COURSE:  Elizabeth Horne  is a 82 y.o. female admitted 03/21/2016 with chief complaint Code Stroke . Please see H&P performed by Auburn Bilberry, MD for further information. Patient presented with right sided facial droop and aphasia. His symptoms concerning for stroke thus code stroke initiated. Full workup by inserting for risk factors relatively negative however, MRI positive for Small acute left MCA infarcts in the frontal lobe. She was evaluated by neurology on admission and throughout her stay at hospital. For risk factor modification she has been started on aspirin and Plavix. Of note she previously had subdural hematoma requiring craniotomy which is why she is on no standing anticoagulation for atrial fibrillation. We have added Plavix to aspirin regiment, and change to cholesterol medication.  DISCHARGE CONDITIONS:   Stable  CONSULTS OBTAINED:  Treatment Team:  Kym Groom, MD  DRUG ALLERGIES:   Allergies  Allergen Reactions  . Tramadol Nausea Only    DISCHARGE MEDICATIONS:   Current Discharge Medication List    START taking these medications   Details  atorvastatin (LIPITOR) 40 MG tablet Take 1 tablet (40 mg total) by mouth daily at 6 PM. Qty: 30 tablet, Refills:  0    clopidogrel (PLAVIX) 75 MG tablet Take 1 tablet (75 mg total) by mouth daily. Qty: 30 tablet, Refills: 0      CONTINUE these medications which have NOT CHANGED   Details  aspirin 81 MG tablet Take 81 mg by mouth daily.    Cholecalciferol (VITAMIN D3) 2000 units capsule Take 1 capsule (2,000 Units total) by mouth daily. Qty: 30 capsule, Refills: 0    cyanocobalamin (,VITAMIN B-12,) 1000 MCG/ML injection INJECT 1 ML IM EACH MONTH Qty: 30 mL, Refills: 11    diltiazem (CARDIZEM CD) 240 MG 24 hr capsule Take 240 mg by mouth daily.    levothyroxine (SYNTHROID, LEVOTHROID) 75 MCG tablet Take 1.5 tablets (112.5 mcg total) by mouth daily before breakfast. Qty: 90 tablet, Refills: 2    meloxicam (MOBIC) 15 MG tablet Take 15 mg by mouth daily.    mirtazapine (REMERON) 7.5 MG tablet TAKE ONE TABLET BY MOUTH AT BEDTIME Qty: 30 tablet, Refills: 5    omeprazole (PRILOSEC) 20 MG capsule TAKE 1 CAPSULE BY MOUTH EVERY DAY Qty: 90 capsule, Refills: 2      STOP taking these medications     furosemide (LASIX) 20 MG tablet      simvastatin (ZOCOR) 40 MG tablet      cetirizine (ZYRTEC) 10 MG tablet          DISCHARGE INSTRUCTIONS:    DIET:  Cardiac diet  DISCHARGE CONDITION:  Stable  ACTIVITY:  Activity as tolerated  OXYGEN:  Home Oxygen: No.   Oxygen Delivery: room air  DISCHARGE LOCATION:  home  If you experience worsening of your admission symptoms, develop shortness of breath, life threatening emergency, suicidal or homicidal thoughts you must seek medical attention immediately by calling 911 or calling your MD immediately  if symptoms less severe.  You Must read complete instructions/literature along with all the possible adverse reactions/side effects for all the Medicines you take and that have been prescribed to you. Take any new Medicines after you have completely understood and accpet all the possible adverse reactions/side effects.   Please note  You were  cared for by a hospitalist during your hospital stay. If you have any questions about your discharge medications or the care you received while you were in the hospital after you are discharged, you can call the unit and asked to speak with the hospitalist on call if the hospitalist that took care of you is not available. Once you are discharged, your primary care physician will handle any further medical issues. Please note that NO REFILLS for any discharge medications will be authorized once you are discharged, as it is imperative that you return to your primary care physician (or establish a relationship with a primary care physician if you do not have one) for your aftercare needs so that they can reassess your need for medications and monitor your lab values.    On the day of Discharge:   VITAL SIGNS:  Blood pressure 127/67, pulse 70, temperature 97.9 F (36.6 C), temperature source Oral, resp. rate 20, height 5' (1.524 m), weight 94 lb 14.4 oz (43.046 kg), SpO2 94 %.  I/O:   Intake/Output Summary (Last 24 hours) at 03/23/16 0805 Last data filed at 03/22/16 1900  Gross per 24 hour  Intake   1584 ml  Output      0 ml  Net   1584 ml    PHYSICAL EXAMINATION:  GENERAL:  82 y.o.-year-old patient lying in the bed with no acute distress.  EYES: Pupils equal, round, reactive to light and accommodation. No scleral icterus. Extraocular muscles intact.  HEENT: Head atraumatic, normocephalic. Oropharynx and nasopharynx clear.  NECK:  Supple, no jugular venous distention. No thyroid enlargement, no tenderness.  LUNGS: Normal breath sounds bilaterally, no wheezing, rales,rhonchi or crepitation. No use of accessory muscles of respiration.  CARDIOVASCULAR: S1, S2 Irregular rhythm. No murmurs, rubs, or gallops.  ABDOMEN: Soft, non-tender, non-distended. Bowel sounds present. No organomegaly or mass.  EXTREMITIES: No pedal edema, cyanosis, or clubbing.  NEUROLOGIC: Cranial nerves II through XII are  intact. Muscle strength 5/5 in all extremities. Sensation intact. Gait not checked.  PSYCHIATRIC: The patient is alert and oriented x 3.  SKIN: No obvious rash, lesion, or ulcer.   DATA REVIEW:   CBC  Recent Labs Lab 03/21/16 1354  WBC 8.2  HGB 14.7  HCT 43.9  PLT 214    Chemistries   Recent Labs Lab 03/21/16 1354  NA 139  K 4.5  CL 100*  CO2 32  GLUCOSE 122*  BUN 43*  CREATININE 1.13*  CALCIUM 9.7  AST 27  ALT 12*  ALKPHOS 65  BILITOT 0.7    Cardiac Enzymes  Recent Labs Lab 03/21/16 1354  TROPONINI <0.03    Microbiology Results  Results for orders placed or performed in visit on 12/18/11  Urine culture     Status: None   Collection Time: 12/18/11  9:10 PM  Result Value Ref Range Status   Micro Text Report   Final       SOURCE: CLEAN CATCH  COMMENT                   NO GROWTH IN 36 HOURS   ANTIBIOTIC                                                        RADIOLOGY:  Mr Sherrin Daisy Contrast  03/22/2016  CLINICAL DATA:  Slurred speech and right-sided facial droop. History of atrial fibrillation, hypertension, and hyperlipidemia. Prior subdural hematoma. EXAM: MRI HEAD WITHOUT CONTRAST MRA HEAD WITHOUT CONTRAST TECHNIQUE: Multiplanar, multiecho pulse sequences of the brain and surrounding structures were obtained without intravenous contrast. Angiographic images of the head were obtained using MRA technique without contrast. COMPARISON:  Head CT 03/21/2016 FINDINGS: MRI HEAD FINDINGS There are multiple small foci of acute cortical and subcortical infarction in the left MCA territory involving the posterior left frontal lobe, with the largest focus measuring 10 mm in the operculum. There is no evidence of associated hemorrhage. There is mild cerebral atrophy, with asymmetric enlargement of the left sylvian fissure. Bilateral cerebral white matter T2 hyperintensities are greatest in the periventricular regions and are nonspecific but compatible with mild  chronic small vessel ischemic disease. A chronic microhemorrhage is noted in the anterior right insula. No mass, midline shift, or extra-axial fluid collection is seen. Prior bilateral cataract extraction is noted. No significant inflammatory disease is seen in the paranasal sinuses are mastoid air cells. Major intracranial vascular flow voids are preserved. MRA HEAD FINDINGS The study is mildly motion degraded. The visualized distal left vertebral artery is widely patent and dominant. The right vertebral artery is hypoplastic and functionally terminates in PICA. Left PICA is patent. AICA and SCA origins are patent. Basilar artery is widely patent and mildly tortuous. There is a fetal type origin of the right PCA. Evaluation of the P2 segments is limited bilaterally by motion artifact as well as artifact near the slab interface, however there is no evidence of flow limiting proximal P2 stenosis. The internal carotid arteries are patent from skullbase to carotid termini. The cavernous segments are mildly ectatic bilaterally. A1 and M1 segments are patent without evidence of significant stenosis. Branch vessel evaluation is mildly limited by motion artifact and artifact at the slab interface, however there is a decreased number of small MCA branch vessels on the right compared to the left with with a discrete small branch vessel occlusion anteriorly in the sylvian fissure. No intracranial aneurysm is identified. IMPRESSION: 1. Small acute left MCA infarcts in the frontal lobe. 2. Mild chronic small vessel ischemic disease. 3. No large vessel occlusion or flow limiting proximal stenosis. Decreased number of right MCA branch vessels. Electronically Signed   By: Sebastian Ache M.D.   On: 03/22/2016 10:15   US Carotid Bilateral  03/21/2016  CLINICAL DATA:  82 year old female with a history of cerebral vascular accident. Cardiovascular risk factors include hypertension, known stroke/TIA, hyperlipidemia, diabetes EXAM:  BILATERAL CAROTID DUPLEX ULTRASOUND TECHNIQUE: Wallace Cullens scale imaging, color Doppler and duplex ultrasound were performed of bilateral carotid and vertebral arteries in the neck. COMPARISON:  No prior duplex FINDINGS: Criteria: Quantification of carotid stenosis is based on velocity parameters that correlate the residual internal carotid diameter with NASCET-based stenosis levels, using the diameter of the distal internal carotid lumen as the denominator for stenosis measurement. The following velocity measurements were  obtained: RIGHT ICA:  Systolic 93 cm/sec, Diastolic 28 cm/sec CCA:  53 cm/sec SYSTOLIC ICA/CCA RATIO:  1.8 ECA:  50 cm/sec LEFT ICA:  Systolic 92 cm/sec, Diastolic 35 cm/sec CCA:  50 cm/sec SYSTOLIC ICA/CCA RATIO:  1.9 ECA:  51 cm/sec Right Brachial SBP: Not acquired Left Brachial SBP: Not acquired RIGHT CAROTID ARTERY: No significant calcified disease of the right common carotid artery. Intermediate waveform maintained. Heterogeneous plaque without significant calcifications at the right carotid bifurcation. Low resistance waveform of the right ICA. Tortuosity. RIGHT VERTEBRAL ARTERY: Antegrade flow with low resistance waveform. LEFT CAROTID ARTERY: No significant calcified disease of the left common carotid artery. Intermediate waveform maintained. Heterogeneous plaque at the left carotid bifurcation without significant calcifications. Low resistance waveform of the left ICA. LEFT VERTEBRAL ARTERY:  Antegrade flow with low resistance waveform. IMPRESSION: Color duplex indicates moderate heterogeneous plaque with no hemodynamically significant stenosis by duplex criteria in the extracranial cerebrovascular circulation. Signed, Yvone NeuJaime S. Loreta AveWagner, DO [facility] Electronically Signed   By: Gilmer MorJaime  Wagner D.O.   On: 03/21/2016 17:08   Mr Maxine GlennMra Head/brain Wo Cm  03/22/2016  CLINICAL DATA:  Slurred speech and right-sided facial droop. History of atrial  fibrillation, hypertension, and hyperlipidemia. Prior subdural hematoma. EXAM: MRI HEAD WITHOUT CONTRAST MRA HEAD WITHOUT CONTRAST TECHNIQUE: Multiplanar, multiecho pulse sequences of the brain and surrounding structures were obtained without intravenous contrast. Angiographic images of the head were obtained using MRA technique without contrast. COMPARISON:  Head CT 03/21/2016 FINDINGS: MRI HEAD FINDINGS There are multiple small foci of acute cortical and subcortical infarction in the left MCA territory involving the posterior left frontal lobe, with the largest focus measuring 10 mm in the operculum. There is no evidence of associated hemorrhage. There is mild cerebral atrophy, with asymmetric enlargement of the left sylvian fissure. Bilateral cerebral white matter T2 hyperintensities are greatest in the periventricular regions and are nonspecific but compatible with mild chronic small vessel ischemic disease. A chronic microhemorrhage is noted in the anterior right insula. No mass, midline shift, or extra-axial fluid collection is seen. Prior bilateral cataract extraction is noted. No significant inflammatory disease is seen in the paranasal sinuses are mastoid air cells. Major intracranial vascular flow voids are preserved. MRA HEAD FINDINGS The study is mildly motion degraded. The visualized distal left vertebral artery is widely patent and dominant. The right vertebral artery is hypoplastic and functionally terminates in PICA. Left PICA is patent. AICA and SCA origins are patent. Basilar artery is widely patent and mildly tortuous. There is a fetal type origin of the right PCA. Evaluation of the P2 segments is limited bilaterally by motion artifact as well as artifact near the slab interface, however there is no evidence of flow limiting proximal P2 stenosis. The internal carotid arteries are patent from skullbase to carotid termini. The cavernous segments are mildly ectatic bilaterally. A1 and M1 segments are  patent without evidence of significant stenosis. Branch vessel evaluation is mildly limited by motion artifact and artifact at the slab interface, however there is a decreased number of small MCA branch vessels on the right compared to the left with with a discrete small branch vessel occlusion anteriorly in the sylvian fissure. No intracranial aneurysm is identified. IMPRESSION: 1. Small acute left MCA infarcts in the frontal lobe. 2. Mild chronic small vessel ischemic disease. 3. No large vessel occlusion or flow limiting proximal stenosis. Decreased number of right MCA branch vessels. Electronically Signed   By: Jolaine ClickAllen  Grady M.D.  On: 03/22/2016 10:15   Ct Head Code Stroke W/o Cm  03/21/2016  CLINICAL DATA:  Acute onset slurred speech. Right-sided facial droop. EXAM: CT HEAD WITHOUT CONTRAST TECHNIQUE: Contiguous axial images were obtained from the base of the skull through the vertex without intravenous contrast. COMPARISON:  Head CT 10/24/2013 FINDINGS: Brain parenchyma: There is periventricular hypoattenuation compatible with chronic microvascular disease. Cortical gray-white differentiation is preserved. The basal ganglia are normal. Insular ribbons are preserved. No intraparenchymal hemorrhage or evidence of acute cortical infarct. No mass lesion or midline shift. Ventricles, sulci and extra-axial spaces: Normal for age. No extra-axial collection. Paranasal sinuses and mastoids: Normal. Visualized orbits: Normal. Skull and extracranial soft tissues: Old left frontal craniotomy defect. IMPRESSION: No acute hemorrhage or evidence of acute cortical infarct. These results were called by telephone at the time of interpretation on 03/21/2016 at 2:09 pm to Dr. Nita Sickle , who verbally acknowledged these results. Electronically Signed   By: Deatra Robinson M.D.   On: 03/21/2016 14:11     Management plans discussed with the patient, family and they are in agreement.  CODE STATUS:     Code Status  Orders        Start     Ordered   03/21/16 1520  Full code   Continuous     03/21/16 1522    Code Status History    Date Active Date Inactive Code Status Order ID Comments User Context   02/26/2015 12:19 PM 02/27/2015  2:05 PM Full Code [id number]  Gracelyn Nurse, MD Inpatient    Advance Directive Documentation        Most Recent Value   Type of Advance Directive  Healthcare Power of Attorney, Living will   Pre-existing out of facility DNR order (yellow form or pink MOST form)     "MOST" Form in Place?        TOTAL TIME TAKING CARE OF THIS PATIENT: 28 minutes.    Samaa Ueda,  Mardi Mainland.D on 03/23/2016 at 8:05 AM  Between 7am to 6pm - Pager - [phone]  After 6pm go to [email or web address] - Social research officer, government  [facility] [place] [facility]  Office  [phone]  CC: Primary care physician; Marikay Alar, MD

## 2016-03-23 NOTE — Care Management Note (Signed)
Case Management Note  Patient Details  Name: Elizabeth Horne MRN: [id number] Date of Birth: Jan 02, 1926  Subjective/Objective:                 Spoke with patient and daughter at the bedside. Patient has 4 hours of private duty HHA per day. No DME needed, has walker and toilet seat riser. [facility]. [facility] [phone]. AVS updated.   Action/Plan:  Will DC to home today. Expected Discharge Date:  03/23/16               Expected Discharge Plan:  Home w Home Health Services  In-House Referral:     Discharge planning Services  CM Consult  Post Acute Care Choice:  Home Health Choice offered to:  Patient, Adult Children  DME Arranged:  N/A DME Agency:  NA  HH Arranged:  RN, PT, OT, Nurse's Aide HH Agency:  NA  Status of Service:  Completed, signed off  If discussed at Long Length of Stay Meetings, dates discussed:    Additional Comments:  Lawerance SabalDebbie Devi Hopman, RN 03/23/2016, 9:27 AM

## 2016-03-23 NOTE — Telephone Encounter (Signed)
Pt is scheduled with Dr Birdie SonsSonnenberg on 07/24 @ 11:30. Thank you!

## 2016-03-23 NOTE — Progress Notes (Signed)
Discharge paperwork reviewed with patient and patient's son & daughter. All verbalized understanding of paperwork. Prescriptions attached and given to patient's daughter. Daughter will transport patient home.

## 2016-03-23 NOTE — Telephone Encounter (Signed)
HFU, Pt is being discharged from the hospital today. Pt was sch with Dr Birdie SonsSonnenberg. Pt wants to see Dr Dan HumphreysWalker nurse was explained to that Dr Dan HumphreysWalker sch was booked and pt was sch with Dr Birdie SonsSonnenberg. Nurse called back and stated that pt only wants to see Dr Dan HumphreysWalker. Let me know where we can sch pt. Thank you!

## 2016-03-26 ENCOUNTER — Ambulatory Visit (INDEPENDENT_AMBULATORY_CARE_PROVIDER_SITE_OTHER): Payer: Medicare Other | Admitting: Family Medicine

## 2016-03-26 ENCOUNTER — Encounter: Payer: Self-pay | Admitting: Family Medicine

## 2016-03-26 VITALS — BP 110/70 | HR 68 | Temp 97.6°F | Wt 94.4 lb

## 2016-03-26 DIAGNOSIS — I480 Paroxysmal atrial fibrillation: Secondary | ICD-10-CM | POA: Diagnosis not present

## 2016-03-26 DIAGNOSIS — I639 Cerebral infarction, unspecified: Secondary | ICD-10-CM

## 2016-03-26 DIAGNOSIS — B070 Plantar wart: Secondary | ICD-10-CM | POA: Diagnosis not present

## 2016-03-26 NOTE — Assessment & Plan Note (Signed)
Patient doing well status post CVA. Is on aspirin and Plavix. Currently on Lipitor as well. We will refer to neurology for follow-up. She will continue to monitor and I went over indications to call 911 regarding stroke symptoms.

## 2016-03-26 NOTE — Progress Notes (Signed)
Marikay Alar, MD Phone: [phone]  Elizabeth Horne is a 82 y.o. female who presents today for follow-up.  Patient was hospitalized last week for left MCA infarcts in the frontal lobe. She had been at the beauty shop and had trouble speaking and had some facial droop. Was unable to write or check and they called an ambulance. Evaluated in the emergency room and admitted to the hospital. No numbness or weakness with this. Notes her face has returned back to normal and talking is not an issue at this time. Saliva the right corner of her mouth though this is significantly improved. She has a history of A. fib for which she is on diltiazem. Notes some palpitations over the last 6 or so months that she notes have improved significantly. Notes her ankles also been swollen bilaterally since coming out of the hospital. Worse today. No calf swelling. No orthopnea or PND. She does note some shortness of breath with exertion for at least the past 6 months. Notes issues with energy and stamina not being as good as previously. She is followed by cardiology and has an appointment with them this afternoon.  Patient additionally notes she has a plantar wart that has been there for some time. She wants to know what to do for this.  PMH: Former smoker   ROS see history of present illness  Objective  Physical Exam Vitals:   03/26/16 1133  BP: 110/70  Pulse: 68  Temp: 97.6 F (36.4 C)    BP Readings from Last 3 Encounters:  03/26/16 110/70  03/23/16 127/67  03/15/16 118/70   Wt Readings from Last 3 Encounters:  03/26/16 94 lb 6 oz (42.8 kg)  03/21/16 94 lb 14.4 oz (43 kg)  03/15/16 93 lb 1.9 oz (42.2 kg)    Physical Exam  Constitutional: No distress.  HENT:  Head: Normocephalic and atraumatic.  Mouth/Throat: Oropharynx is clear and moist.  Eyes: Conjunctivae are normal. Pupils are equal, round, and reactive to light.  Cardiovascular: Normal heart sounds.  An irregularly irregular rhythm  present.  2+ pitting edema  Pulmonary/Chest: Effort normal and breath sounds normal.  Neurological: She is alert. Gait normal.  CN 2-12 intact, 5/5 strength in bilateral biceps, triceps, grip, quads, hamstrings, plantar and dorsiflexion, sensation to light touch intact in bilateral UE and LE, normal gait, 2+ patellar reflexes  Skin: Skin is warm and dry. She is not diaphoretic.  Plantar wart noted on right ball of foot     Assessment/Plan: Please see individual problem list.  Atrial fibrillation Sounds as though she is in A. fib at this time. She is rate controlled. Currently on diltiazem. I wonder if this may be contributing to her exertional shortness breath and issues with stamina. She also has some swelling in her bilateral ankles. She has an appointment with her cardiologist afternoon and I advised her to keep this appointment and discuss these issues with him to see if there is any intervention that needs to be done. She will continue aspirin and Plavix as she has previously had a subdural hematoma that limits the use of anticoagulation at this time. She is given return precautions.  CVA (cerebral infarction) Patient doing well status post CVA. Is on aspirin and Plavix. Currently on Lipitor as well. We will refer to neurology for follow-up. She will continue to monitor and I went over indications to call 911 regarding stroke symptoms.  Plantar wart of right foot Lesion on right plantar surface of her foot consistent  with wart. Advised salicylic acid pads over-the-counter.   Orders Placed This Encounter  Procedures  . Ambulatory referral to Neurology    Referral Priority:   Routine    Referral Type:   Consultation    Referral Reason:   Specialty Services Required    Requested Specialty:   Neurology    Number of Visits Requested:   1    Marikay Alar, MD [facility]

## 2016-03-26 NOTE — Assessment & Plan Note (Signed)
Sounds as though she is in A. fib at this time. She is rate controlled. Currently on diltiazem. I wonder if this may be contributing to her exertional shortness breath and issues with stamina. She also has some swelling in her bilateral ankles. She has an appointment with her cardiologist afternoon and I advised her to keep this appointment and discuss these issues with him to see if there is any intervention that needs to be done. She will continue aspirin and Plavix as she has previously had a subdural hematoma that limits the use of anticoagulation at this time. She is given return precautions.

## 2016-03-26 NOTE — Assessment & Plan Note (Signed)
Lesion on right plantar surface of her foot consistent with wart. Advised salicylic acid pads over-the-counter.

## 2016-03-26 NOTE — Patient Instructions (Signed)
Nice to meet you. Please make sure you take your aspirin and Plavix daily. If you have issues with bleeding please let us know. Please keep your follow-up appointment with cardiology later today. We will refer you to neurology for further evaluation. If you develop numbness, weakness, change in vision, facial droop, slurred speech, persistent palpitations, chest pain, shortness of breath, or any new or change in symptoms please seek medical attention immediately.

## 2016-03-29 ENCOUNTER — Ambulatory Visit: Payer: Medicare Other | Admitting: Family Medicine

## 2016-04-11 ENCOUNTER — Ambulatory Visit (INDEPENDENT_AMBULATORY_CARE_PROVIDER_SITE_OTHER): Payer: Medicare Other

## 2016-04-11 DIAGNOSIS — E538 Deficiency of other specified B group vitamins: Secondary | ICD-10-CM

## 2016-04-11 MED ORDER — CYANOCOBALAMIN 1000 MCG/ML IJ SOLN
1000.0000 ug | Freq: Once | INTRAMUSCULAR | Status: AC
  Administered 2016-04-11: 1000 ug via INTRAMUSCULAR

## 2016-04-11 NOTE — Progress Notes (Signed)
Patient came in for b12 injection.  Received in Right deltoid.  Patient tolerated well.  

## 2016-04-30 ENCOUNTER — Other Ambulatory Visit: Payer: Self-pay | Admitting: Family Medicine

## 2016-04-30 NOTE — Telephone Encounter (Signed)
Please advise on these refills. 

## 2016-05-01 NOTE — Telephone Encounter (Signed)
Refill sent to pharmacy.   

## 2016-05-09 ENCOUNTER — Ambulatory Visit (INDEPENDENT_AMBULATORY_CARE_PROVIDER_SITE_OTHER): Payer: Medicare Other | Admitting: Family Medicine

## 2016-05-09 ENCOUNTER — Encounter: Payer: Self-pay | Admitting: Family Medicine

## 2016-05-09 VITALS — BP 134/88 | HR 91 | Temp 97.6°F | Wt 92.8 lb

## 2016-05-09 DIAGNOSIS — E039 Hypothyroidism, unspecified: Secondary | ICD-10-CM

## 2016-05-09 DIAGNOSIS — F0390 Unspecified dementia without behavioral disturbance: Secondary | ICD-10-CM

## 2016-05-09 DIAGNOSIS — I639 Cerebral infarction, unspecified: Secondary | ICD-10-CM

## 2016-05-09 DIAGNOSIS — E038 Other specified hypothyroidism: Secondary | ICD-10-CM

## 2016-05-09 NOTE — Assessment & Plan Note (Addendum)
Family expresses concerns regarding the patient's ability to fully care for herself. They currently have in-home care though insurance is providing 2 hours of coverage per day. Given the discussion above I feel as though the patient would benefit from at least 6 hours of coverage per day. The patient's daughter will contact us with the fax number for the insurance company so we can fax a letter and our note. I additionally offered [facility] to help with regards to her medication though the patient declined.

## 2016-05-09 NOTE — Assessment & Plan Note (Signed)
Continue Synthroid. Check TSH today. 

## 2016-05-09 NOTE — Patient Instructions (Signed)
Nice to see you. We'll check your thyroid function. We will additionally write a letter regarding your help at home. Please call us with the fax number.

## 2016-05-09 NOTE — Assessment & Plan Note (Signed)
Doing well. Continue aspirin and Plavix. Continue Lipitor. She'll keep her appointment with neurology.

## 2016-05-09 NOTE — Progress Notes (Signed)
Pre visit review using our clinic review tool, if applicable. No additional management support is needed unless otherwise documented below in the visit note. 

## 2016-05-09 NOTE — Progress Notes (Signed)
  Marikay AlarEric Shontia Gillooly, MD Phone: [phone]  Elizabeth Horne is a 82 y.o. female who presents today for follow-up.  Stroke: Patient notes she is doing well with this. No new neurological deficits. No residual neurological deficits. No numbness or weakness. No speech changes. She's taking aspirin and Plavix.  HYPOTHYROIDISM Disease Monitoring Weight changes: No  Skin Changes: No Heat/Cold intolerance: No  Medication Monitoring Compliance:  Taking Synthroid   Last TSH:   Lab Results  Component Value Date   TSH 7.50 (H) 03/08/2016   Patient's daughter and family members also report concerns regarding her ability to care for herself. There are some concerns regarding her short-term memory following the stroke as well. Currently they do have help coming in 6 hours a day. Their insurance company is covering 2 hours of this. They help the patient with her lunch and laundry. Patient is unable to do laundry on her own. She needs help preparing all of her meals. She is able to feed herself. She is able to toilet herself. She is able to for the most part dress herself with the exception of some difficulty with buttons. She does not drive. She does not do grocery shopping. She additionally needs help with her medications and they're noticing that she is missing some of her nighttime doses. She is able to bathe herself though has difficulty washing her own hair.  PMH: Former smoker   ROS see history of present illness  Objective  Physical Exam Vitals:   05/09/16 1528  BP: 134/88  Pulse: 91  Temp: 97.6 F (36.4 C)    BP Readings from Last 3 Encounters:  05/09/16 134/88  03/26/16 110/70  03/23/16 127/67   Wt Readings from Last 3 Encounters:  05/09/16 92 lb 12.8 oz (42.1 kg)  03/26/16 94 lb 6 oz (42.8 kg)  03/21/16 94 lb 14.4 oz (43 kg)    Physical Exam  Constitutional: No distress.  HENT:  Mouth/Throat: Oropharynx is clear and moist. No oropharyngeal exudate.  Eyes: Conjunctivae are  normal. Pupils are equal, round, and reactive to light.  Cardiovascular: Normal rate and normal heart sounds.  An irregularly irregular rhythm present.  Pulmonary/Chest: Effort normal and breath sounds normal.  Neurological: She is alert.  CN 2-12 intact, 5/5 strength in bilateral biceps, triceps, grip, quads, hamstrings, plantar and dorsiflexion, sensation to light touch intact in bilateral UE and LE, normal gait, 2+ patellar reflexes  Skin: Skin is warm and dry. She is not diaphoretic.     Assessment/Plan: Please see individual problem list.  CVA (cerebral infarction) Doing well. Continue aspirin and Plavix. Continue Lipitor. She'll keep her appointment with neurology.  Dementia Family expresses concerns regarding the patient's ability to fully care for herself. They currently have in-home care though insurance is providing 2 hours of coverage per day. Given the discussion above I feel as though the patient would benefit from at least 6 hours of coverage per day. The patient's daughter will contact us with the fax number for the insurance company so we can fax a letter and our note. I additionally offered [facility] to help with regards to her medication though the patient declined.  Hypothyroidism, secondary Continue Synthroid. Check TSH today.   Orders Placed This Encounter  Procedures  . TSH    Marikay AlarEric Rayjon Wery, MD [facility] [place]

## 2016-05-10 ENCOUNTER — Telehealth: Payer: Self-pay | Admitting: *Deleted

## 2016-05-10 LAB — TSH: TSH: 0.52 u[IU]/mL (ref 0.35–4.50)

## 2016-05-10 NOTE — Telephone Encounter (Signed)
Please compose letter for insurance company.

## 2016-05-10 NOTE — Telephone Encounter (Signed)
Patient was advised to call info in to Dr Birdie SonsSonnenberg Fax letter to [employer] company Fax [phone]  reference numbers  Policy number [id number] Claim # [id number] Please contact Inocencio HomesGayle [phone]

## 2016-05-14 NOTE — Telephone Encounter (Signed)
Erskine SquibbGail Wailes called checking the status of letter Best number [phone]

## 2016-05-15 ENCOUNTER — Ambulatory Visit: Payer: Medicare Other

## 2016-05-15 NOTE — Telephone Encounter (Signed)
Letter created. Please fax to the patient's insurance company. Please include the most recent office visit. Please let the patient's daughter know this has been faxed.

## 2016-05-16 ENCOUNTER — Ambulatory Visit: Payer: Medicare Other

## 2016-05-16 ENCOUNTER — Ambulatory Visit (INDEPENDENT_AMBULATORY_CARE_PROVIDER_SITE_OTHER): Payer: Medicare Other

## 2016-05-16 DIAGNOSIS — E538 Deficiency of other specified B group vitamins: Secondary | ICD-10-CM | POA: Diagnosis not present

## 2016-05-16 MED ORDER — CYANOCOBALAMIN 1000 MCG/ML IJ SOLN: 1000.0000 ug | Freq: Once | INTRAMUSCULAR | Status: DC

## 2016-05-16 NOTE — Progress Notes (Signed)
Patient came in for B12 injection, Received in Left deltoid.  Patient tolerated well. thanks

## 2016-05-16 NOTE — Telephone Encounter (Signed)
Faxed form to insurance company and called the patients daughter to inform her that they had been faxed.

## 2016-05-18 NOTE — Progress Notes (Signed)
I reviewed the above information and agree.  Marikay AlarEric Danaye Sobh, M.D.

## 2016-06-19 ENCOUNTER — Ambulatory Visit (INDEPENDENT_AMBULATORY_CARE_PROVIDER_SITE_OTHER): Payer: Medicare Other

## 2016-06-19 ENCOUNTER — Ambulatory Visit: Payer: Medicare Other

## 2016-06-19 DIAGNOSIS — Z23 Encounter for immunization: Secondary | ICD-10-CM

## 2016-06-19 DIAGNOSIS — E538 Deficiency of other specified B group vitamins: Secondary | ICD-10-CM | POA: Diagnosis not present

## 2016-06-19 MED ORDER — CYANOCOBALAMIN 1000 MCG/ML IJ SOLN
1000.0000 ug | Freq: Once | INTRAMUSCULAR | Status: AC
  Administered 2016-06-19: 1000 ug via INTRAMUSCULAR

## 2016-06-19 NOTE — Progress Notes (Signed)
Patient came in for B12 injection. Received in right deltoid.  Patient tolerated well.  Patient also received a flu shot.

## 2016-06-28 NOTE — Progress Notes (Signed)
I have reviewed the above note and agree.  Merlene Dante, M.D.  

## 2016-07-11 ENCOUNTER — Encounter
Admission: RE | Admit: 2016-07-11 | Discharge: 2016-07-11 | Disposition: A | Discharge status: Home / Self Care | Payer: Medicare Other | Source: Ambulatory Visit | Attending: Internal Medicine | Admitting: Internal Medicine

## 2016-07-12 ENCOUNTER — Inpatient Hospital Stay
Admission: EM | Admit: 2016-07-12 | Discharge: 2016-08-03 | DRG: 683 | Disposition: E | Payer: Medicare Other | Attending: Internal Medicine | Admitting: Internal Medicine

## 2016-07-12 ENCOUNTER — Emergency Department: Payer: Medicare Other

## 2016-07-12 DIAGNOSIS — Z8673 Personal history of transient ischemic attack (TIA), and cerebral infarction without residual deficits: Secondary | ICD-10-CM

## 2016-07-12 DIAGNOSIS — E875 Hyperkalemia: Secondary | ICD-10-CM | POA: Diagnosis not present

## 2016-07-12 DIAGNOSIS — S0990XA Unspecified injury of head, initial encounter: Secondary | ICD-10-CM

## 2016-07-12 DIAGNOSIS — N179 Acute kidney failure, unspecified: Secondary | ICD-10-CM | POA: Diagnosis not present

## 2016-07-12 DIAGNOSIS — E039 Hypothyroidism, unspecified: Secondary | ICD-10-CM | POA: Diagnosis present

## 2016-07-12 DIAGNOSIS — I129 Hypertensive chronic kidney disease with stage 1 through stage 4 chronic kidney disease, or unspecified chronic kidney disease: Secondary | ICD-10-CM | POA: Diagnosis present

## 2016-07-12 DIAGNOSIS — I482 Chronic atrial fibrillation: Secondary | ICD-10-CM | POA: Diagnosis present

## 2016-07-12 DIAGNOSIS — Z7982 Long term (current) use of aspirin: Secondary | ICD-10-CM

## 2016-07-12 DIAGNOSIS — Z90710 Acquired absence of both cervix and uterus: Secondary | ICD-10-CM

## 2016-07-12 DIAGNOSIS — Z96643 Presence of artificial hip joint, bilateral: Secondary | ICD-10-CM | POA: Diagnosis present

## 2016-07-12 DIAGNOSIS — N183 Chronic kidney disease, stage 3 (moderate): Secondary | ICD-10-CM | POA: Diagnosis present

## 2016-07-12 DIAGNOSIS — H9190 Unspecified hearing loss, unspecified ear: Secondary | ICD-10-CM | POA: Diagnosis present

## 2016-07-12 DIAGNOSIS — Y92009 Unspecified place in unspecified non-institutional (private) residence as the place of occurrence of the external cause: Secondary | ICD-10-CM

## 2016-07-12 DIAGNOSIS — R402412 Glasgow coma scale score 13-15, at arrival to emergency department: Secondary | ICD-10-CM | POA: Diagnosis present

## 2016-07-12 DIAGNOSIS — Z87891 Personal history of nicotine dependence: Secondary | ICD-10-CM

## 2016-07-12 DIAGNOSIS — Z96612 Presence of left artificial shoulder joint: Secondary | ICD-10-CM | POA: Diagnosis present

## 2016-07-12 DIAGNOSIS — E785 Hyperlipidemia, unspecified: Secondary | ICD-10-CM | POA: Diagnosis present

## 2016-07-12 DIAGNOSIS — S81811A Laceration without foreign body, right lower leg, initial encounter: Secondary | ICD-10-CM

## 2016-07-12 DIAGNOSIS — S0101XA Laceration without foreign body of scalp, initial encounter: Secondary | ICD-10-CM | POA: Diagnosis present

## 2016-07-12 DIAGNOSIS — Z66 Do not resuscitate: Secondary | ICD-10-CM | POA: Diagnosis present

## 2016-07-12 DIAGNOSIS — D631 Anemia in chronic kidney disease: Secondary | ICD-10-CM | POA: Diagnosis present

## 2016-07-12 DIAGNOSIS — K219 Gastro-esophageal reflux disease without esophagitis: Secondary | ICD-10-CM | POA: Diagnosis present

## 2016-07-12 DIAGNOSIS — M79604 Pain in right leg: Secondary | ICD-10-CM | POA: Diagnosis not present

## 2016-07-12 DIAGNOSIS — Z7902 Long term (current) use of antithrombotics/antiplatelets: Secondary | ICD-10-CM

## 2016-07-12 DIAGNOSIS — W1830XA Fall on same level, unspecified, initial encounter: Secondary | ICD-10-CM | POA: Diagnosis present

## 2016-07-12 DIAGNOSIS — F0390 Unspecified dementia without behavioral disturbance: Secondary | ICD-10-CM | POA: Diagnosis present

## 2016-07-12 DIAGNOSIS — R52 Pain, unspecified: Secondary | ICD-10-CM | POA: Diagnosis present

## 2016-07-12 DIAGNOSIS — Z885 Allergy status to narcotic agent status: Secondary | ICD-10-CM

## 2016-07-12 DIAGNOSIS — S8011XA Contusion of right lower leg, initial encounter: Secondary | ICD-10-CM

## 2016-07-12 DIAGNOSIS — R410 Disorientation, unspecified: Secondary | ICD-10-CM | POA: Diagnosis not present

## 2016-07-12 DIAGNOSIS — D62 Acute posthemorrhagic anemia: Secondary | ICD-10-CM | POA: Diagnosis present

## 2016-07-12 DIAGNOSIS — M25559 Pain in unspecified hip: Secondary | ICD-10-CM

## 2016-07-12 DIAGNOSIS — Z96611 Presence of right artificial shoulder joint: Secondary | ICD-10-CM | POA: Diagnosis present

## 2016-07-12 LAB — CBC
HEMATOCRIT: 35.4 % (ref 35.0–47.0)
HEMOGLOBIN: 11.6 g/dL — AB (ref 12.0–16.0)
MCH: 31.5 pg (ref 26.0–34.0)
MCHC: 32.8 g/dL (ref 32.0–36.0)
MCV: 95.9 fL (ref 80.0–100.0)
Platelets: 189 10*3/uL (ref 150–440)
RBC: 3.69 MIL/uL — ABNORMAL LOW (ref 3.80–5.20)
RDW: 14.2 % (ref 11.5–14.5)
WBC: 17.9 10*3/uL — AB (ref 3.6–11.0)

## 2016-07-12 LAB — COMPREHENSIVE METABOLIC PANEL
ALBUMIN: 3.5 g/dL (ref 3.5–5.0)
ALT: 28 U/L (ref 14–54)
AST: 38 U/L (ref 15–41)
Alkaline Phosphatase: 99 U/L (ref 38–126)
Anion gap: 12 (ref 5–15)
BILIRUBIN TOTAL: 0.5 mg/dL (ref 0.3–1.2)
BUN: 43 mg/dL — ABNORMAL HIGH (ref 6–20)
CO2: 24 mmol/L (ref 22–32)
Calcium: 8.7 mg/dL — ABNORMAL LOW (ref 8.9–10.3)
Chloride: 103 mmol/L (ref 101–111)
Creatinine, Ser: 1.59 mg/dL — ABNORMAL HIGH (ref 0.44–1.00)
GFR calc Af Amer: 32 mL/min — ABNORMAL LOW (ref >60)
GFR, EST NON AFRICAN AMERICAN: 28 mL/min — AB (ref >60)
GLUCOSE: 110 mg/dL — AB (ref 65–99)
POTASSIUM: 4.2 mmol/L (ref 3.5–5.1)
Sodium: 139 mmol/L (ref 135–145)
TOTAL PROTEIN: 5.9 g/dL — AB (ref 6.5–8.1)

## 2016-07-12 MED ORDER — SODIUM CHLORIDE 0.9 % IV BOLUS (SEPSIS)
500.0000 mL | Freq: Once | INTRAVENOUS | Status: AC
  Administered 2016-07-13: 500 mL via INTRAVENOUS

## 2016-07-12 MED ORDER — HYDROMORPHONE HCL 1 MG/ML IJ SOLN
INTRAMUSCULAR | Status: AC
  Administered 2016-07-12: 1 mg via INTRAVENOUS
  Filled 2016-07-12: qty 1

## 2016-07-12 MED ORDER — HYDROMORPHONE HCL 1 MG/ML IJ SOLN
1.0000 mg | Freq: Once | INTRAMUSCULAR | Status: AC
Start: 2016-07-12 — End: 2016-07-12
  Administered 2016-07-12: 1 mg via INTRAVENOUS

## 2016-07-12 MED ORDER — ONDANSETRON HCL 4 MG/2ML IJ SOLN
4.0000 mg | Freq: Once | INTRAMUSCULAR | Status: AC
  Administered 2016-07-12: 4 mg via INTRAVENOUS
  Filled 2016-07-12: qty 2

## 2016-07-12 MED ORDER — ONDANSETRON HCL 4 MG/2ML IJ SOLN
INTRAMUSCULAR | Status: AC
  Administered 2016-07-12: 4 mg via INTRAVENOUS
  Filled 2016-07-12: qty 2

## 2016-07-12 MED ORDER — MORPHINE SULFATE (PF) 4 MG/ML IV SOLN
2.0000 mg | Freq: Once | INTRAVENOUS | Status: DC
Start: 2016-07-12 — End: 2016-07-15

## 2016-07-12 MED ORDER — ONDANSETRON HCL 4 MG/2ML IJ SOLN
4.0000 mg | Freq: Once | INTRAMUSCULAR | Status: AC
  Administered 2016-07-12: 4 mg via INTRAVENOUS

## 2016-07-12 NOTE — ED Notes (Signed)
Quigley,MD consulted. MD made aware of presenting complaints and triage assessment. MD with [facility] for:  1. PIV placement 2. Dilaudid 1mg  IVP 3. Zofran 4mg  IVP 4. EKG 5. CBC 6. CMP 7. CT head 8. Diagnostic plain film of RIGHT elbow 9. Diagnostic plain film of RIGHT hip/pelvis 10. Diagnostic plain film of RIGHT tib/fib  Orders to be entered and carried by ED nursing staff. Radiology made aware.

## 2016-07-12 NOTE — ED Triage Notes (Addendum)
Patient presents to the ED via EMS from home. Patient reports that she fell tonight citing the fact that she "just crumped over". EMS arrived and placed PIV; 50 mcg Fentanyl given prior to moving patient to the stretcher in response to significant pain. Patient moved to truck and Fentanyl dose was repeated for a total dose of 100 mcg. En route to the ED, patient reported to have gone unresponsive and apneic; Narcan 1mg  IVP given; patient began to breath again on her own per EMS report. Presents tonight with c/o pain in her head, RIGHT elbow, RIGHT hip, and RIGHT tib/fib. Bruising noted to RFA/wrist/hand. Presents CAO x 4.  Patient on anticoagulant therapy; PMH significant for A.fib.

## 2016-07-12 NOTE — ED Provider Notes (Signed)
Time Seen: Approximately 2036  I have reviewed the triage notes  Chief Complaint: Fall; Head Injury; Hip Pain; and Leg Pain   History of Present Illness: Elizabeth Horne is a 82 y.o. female who presents after a non-syncopal fall at home. Patient was transported here by EMS. Patient received fentanyl with a brief apneic response which corrected with Narcan. Patient's main concerns for right-sided hip and leg pain. She did strike her head but denies any loss of consciousness. She denies any nausea, vomiting. She struck mainly the right side of her elbow along with her hip region. She denies any midline cervical spine pain, thoracic pain, or lumbar spine discomfort. He has not ambulated since her fall and does have a history of previous surgery and chronic atrial fibrillation. Patient's currently on Plavix but no other blood thinners. Her social situation is complicated in that she mainly lives by herself and called EMS after a fall. She's also has a sign that normally he is living in the area but is currently out of town.   Past Medical History:  Diagnosis Date  . A-fib (HCC)   . Arthritis   . Hyperlipidemia   . Hypertension   . Subdural hematoma (HCC)   . Thyroid disease     Patient Active Problem List   Diagnosis Date Noted  . Plantar wart of right foot 03/26/2016  . CVA (cerebral infarction) 03/21/2016  . Bereavement 03/08/2016  . Dementia 10/19/2015  . Baker's cyst of knee 03/16/2015  . Adjustment disorder with mixed anxiety and depressed mood 07/07/2014  . HLD (hyperlipidemia) 06/08/2014  . BP (high blood pressure) 06/08/2014  . Hypothyroidism, secondary 06/08/2014  . Osteoporosis, post-menopausal 06/08/2014  . Intracranial subdural hematoma (HCC) 06/08/2014  . Basilar artery insufficiency 06/08/2014  . Allergic rhinitis 06/08/2014  . Atrial fibrillation (HCC) 06/08/2014  . Cachexia (HCC) 06/08/2014  . H/O surgical procedure 01/29/2014  . Wedge fracture of thoracic  vertebra (HCC) 01/29/2014  . CN (constipation) 01/29/2014  . Bronchitis, chronic (HCC) 01/11/2014  . H/O gastrointestinal disease 01/11/2014  . Arthritis, degenerative 01/11/2014    Past Surgical History:  Procedure Laterality Date  . ABDOMINAL HYSTERECTOMY     menorrhagia  . CRANIOTOMY    . JOINT REPLACEMENT    . KYPHOPLASTY  2015   x 2, Dr. Rosita Kea  . THYROIDECTOMY, PARTIAL     benign  . TONSILLECTOMY AND ADENOIDECTOMY    . TOTAL HIP ARTHROPLASTY Bilateral   . TOTAL SHOULDER ARTHROPLASTY Bilateral   . VAGINAL DELIVERY     2    Past Surgical History:  Procedure Laterality Date  . ABDOMINAL HYSTERECTOMY     menorrhagia  . CRANIOTOMY    . JOINT REPLACEMENT    . KYPHOPLASTY  2015   x 2, Dr. Rosita Kea  . THYROIDECTOMY, PARTIAL     benign  . TONSILLECTOMY AND ADENOIDECTOMY    . TOTAL HIP ARTHROPLASTY Bilateral   . TOTAL SHOULDER ARTHROPLASTY Bilateral   . VAGINAL DELIVERY     2    Current Outpatient Rx  . Order #: [id number] Class: Historical Med  . Order #: [id number] Class: Normal  . Order #: [id number] Class: No Print  . Order #: [id number] Class: Normal  . Order #: [id number] Class: Normal  . Order #: [id number] Class: Historical Med  . Order #: [id number] Class: Historical Med  . Order #: [id number] Class: Normal  . Order #: [id number] Class: Historical Med  . Order #: [id number] Class: Normal  . Order #: [id number] Class: Normal    Allergies:  Tramadol  Family History: Family History  Problem Relation Age of Onset  . Lung disease Sister     Social History: Social History  Substance Use Topics  . Smoking status: Former Smoker    Types: Cigarettes    Quit date: 06/09/1987  . Smokeless tobacco: Never Used  . Alcohol use 0.6 oz/week    1 Glasses of wine per week     Comment: daily glass of wine or 2     Review of Systems:   10 point review of systems was performed and was otherwise negative: Patient states she was doing fine prior to her fall. Constitutional: No fever Eyes:  No visual disturbances ENT: No sore throat, ear pain Cardiac: No chest pain Respiratory: No shortness of breath, wheezing, or stridor Abdomen: No abdominal pain, no vomiting, No diarrhea Endocrine: No weight loss, No night sweats Extremities: No peripheral edema, cyanosis Skin: No rashes, easy bruising Neurologic: No focal weakness, trouble with speech or swollowing Urologic: No dysuria, Hematuria, or urinary frequency   Physical Exam:  ED Triage Vitals  Enc Vitals Group     BP 09/28/2015 2030 106/85     Pulse Rate 09/28/2015 2038 67     Resp 09/28/2015 2030 (!) 22     Temp 09/28/2015 2038 97.5 F (36.4 C)     Temp Source 09/28/2015 2038 Oral     SpO2 09/28/2015 2038 99 %     Weight 09/28/2015 2043 90 lb (40.8 kg)     Height --      Head Circumference --      Peak Flow --      Pain Score 09/28/2015 2043 8     Pain Loc --      Pain Edu? --      Excl. in GC? --     General: Awake , Alert , and Oriented times 3; GCS 15 Head: Normal cephalic , She has a small laceration on her parietal region of her scalp with only minimal active bleeding. Skin or only with no subcutaneous involvement or separation. No crepitus or step-off is noted Eyes: Pupils equal , round, reactive to light Nose/Throat: No nasal drainage, patent upper airway without erythema or exudate.  Neck: Supple, good flexion, extension, and rotation without any pain or neuropraxia Lungs: Clear to ascultation without wheezes , rhonchi, or rales Heart: Regular rate, irregular rhythm without murmurs , gallops , or rubs Abdomen: Soft, non tender without rebound, guarding , or rigidity; bowel sounds positive and symmetric in all 4 quadrants. No organomegaly .        Extremities: Patient has tenderness primarily on the lateral surface of her right hip and right elbow without any obvious crepitus or step-off noted. There is no malrotation of the hip on the right side and appears to be neurovascularly intact.  She has an extremely large  hematoma with skin tear located over the anterior surface and slightly into the calf and posterior region appears to be a very large hematoma. Extremities are otherwise neurovascularly intact and the patient has good supination and pronation at the right upper extremity Neurologic: normal ambulation, Motor symmetric without deficits, sensory intact Skin: Patient has some other large blood blister/hematoma is located in right hand and wrist area Labs:   All laboratory work was reviewed including any pertinent negatives or positives listed below:  Labs Reviewed  COMPREHENSIVE METABOLIC PANEL - Abnormal; Notable for the following:       Result Value   Glucose, Bld 110 (*)  BUN 43 (*)    Creatinine, Ser 1.59 (*)    Calcium 8.7 (*)    Total Protein 5.9 (*)    GFR calc non Af Amer 28 (*)    GFR calc Af Amer 32 (*)    All other components within normal limits  CBC - Abnormal; Notable for the following:    WBC 17.9 (*)    RBC 3.69 (*)    Hemoglobin 11.6 (*)    All other components within normal limits    EKG:  ED ECG REPORT I, Jennye Moccasin, the attending physician, personally viewed and interpreted this ECG.  Date: 07/11/2016 EKG Time: 2221 Rate: 77Rhythm: Atrial fibrillation QRS Axis: normal Intervals: normal ST/T Wave abnormalities: normal Conduction Disturbances: none Narrative Interpretation: unremarkable No acute ischemic changes   Radiology:  "Dg Elbow Complete Right  Result Date: 07/04/2016 CLINICAL DATA:  Larey Seat tonight at home. Pain in the right elbow, right hip, and right lower leg. Abrasions to the right lower leg. EXAM: RIGHT ELBOW - COMPLETE 3+ VIEW COMPARISON:  None. FINDINGS: Diffuse bone demineralization. No evidence of acute fracture or dislocation. No focal bone lesion or bone destruction. No effusions. Narrowed irregularity demonstrated at the midshaft right humerus at the upper edge of the image corresponding to the distal aspect of the shoulder arthroplasty  component. Soft tissues are unremarkable. IMPRESSION: No acute bony abnormalities. Electronically Signed   By: Burman Nieves M.D.   On: 07/13/2016 21:50   Dg Tibia/fibula Right  Result Date: 07/31/2016 CLINICAL DATA:  Patient fell with right leg pain and abrasions EXAM: RIGHT TIBIA AND FIBULA - 2 VIEW COMPARISON:  Right knee radiographs from 03/03/2014 FINDINGS: Soft tissue masslike abnormality suspicious for hematoma measuring approximately 7.6 x 3.3 x 5.4 cm along the medial and posterior aspect of the right calf. No acute fracture nor bone destruction. 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 Chondrocalcinosis of hyaline cartilage is seen. IMPRESSION: Soft tissue masslike abnormality in the proximal calf posteromedially measuring 7.6 x 3.3 x 5.4 cm. This suspicious for hematoma. No underlying fracture is seen. There is osteoarthritis of the knee joint. Electronically Signed   By: Tollie Eth M.D.   On: 07/31/2016 21:52   Ct Head Wo Contrast  Result Date: 07/25/2016 CLINICAL DATA:  Patient presents to the ED via EMS from home. Patient reports that she fell tonight citing the fact that she "just crumped over". Presents tonight with c/o pain in her head, RIGHT elbow, RIGHT hip, and RIGHT tib/fib. Bruising noted to the right frontal area. History of subdural hematoma and craniotomy. EXAM: CT HEAD WITHOUT CONTRAST TECHNIQUE: Contiguous axial images were obtained from the base of the skull through the vertex without intravenous contrast. COMPARISON:  03/21/2016 FINDINGS: Brain: There is significant central cortical atrophy. Periventricular white matter changes are consistent with small vessel disease. There is no intra or extra-axial fluid collection or mass lesion. The basilar cisterns and ventricles have a normal appearance. There is no CT evidence for acute infarction or hemorrhage. Vascular: There is atherosclerotic calcification of the internal carotid arteries. Skull: Left frontal and  left parietal burr holes. No acute fracture. Sinuses/Orbits: No acute finding. Other: Right frontal scalp edema/hematoma.  No underlying fracture. IMPRESSION: 1. Atrophy and small vessel disease. 2.  No evidence for acute intracranial abnormality. 3. Right frontal scalp edema/hematoma without underlying fracture. Electronically Signed   By: Norva Pavlov M.D.   On: 07/17/2016 21:18   Dg Hip Unilat W Or Wo Pelvis 2-3 Views Right  Result Date: 07/13/2016 CLINICAL DATA:  Right hip pain after a fall tonight. EXAM: DG HIP (WITH OR WITHOUT PELVIS) 2-3V RIGHT COMPARISON:  CT abdomen and pelvis 11/07/2015.  Pelvis 03/03/2014 FINDINGS: Postoperative changes with bilateral total hip arthroplasties. The most distal portion of the left hip arthroplasty is not included within the field of view. Visualize components appear well seated. No component dislocation. Diffuse bone demineralization. Old healed fracture deformities of the superior and inferior pubic rami bilaterally. 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 No evidence of acute fracture or dislocation. Visualized sacrum appears intact. Postoperative changes consistent with hernia repair on the left. IMPRESSION: Bilateral total hip arthroplasties. Old pubic ramus fracture deformities bilaterally. No acute bony abnormalities. Electronically Signed   By: Burman NievesWilliam  Stevens M.D.   On: 29-Jun-2016 21:54  "  I personally reviewed the radiologic studies   Procedures:  The patient had lancing of her large right lower extremity hematoma. 15 blade was used with an evacuation of a portion of very large underlying clot. Once the hematoma secondary to affect it seemed to be causing consistent pain due to the pressure. No signs of compartment syndrome. The wound was packed by the nursing staff with wet-to-dry dressings. Patient appeared to tolerate the procedure well     ED Course:  Patient's had to require repeat pain medications and I felt she required  observation secondary to discomfort especially from the right lower extremity and hip region. Patient also have to below the demonstrated some form of weightbearing prior to discharge. A large right-sided hematoma which was partially evacuated. She does not appear to have suffered any significant intracranial injury and does have a history of a subdural hematoma. Most likely is why she is not on more aggressive anticoagulation therapy for atrial fibrillation. The patient's family is aware of her being here in emergency department and phone contact was then made through her friends. Clinical Course      Assessment: * Status post non-syncopal fall Mild acute head trauma Right hip discomfort with previous surgery and previous pelvic fractures Large right-sided anterior tibial hematoma Partial evacuation of hematoma Skin tear right lower extremity  Final Clinical Impression:  Final diagnoses:  Right leg pain  Injury of head, initial encounter     Plan:  Inpatient management            Jennye MoccasinBrian S Avrielle Fry, MD July 14, 2016 2335

## 2016-07-12 NOTE — ED Notes (Signed)
EDP lanced RLE hematoma and removed partial clotted blood. Incision dressed with wet-to-dry dressing as directed, covered with abd pad and kerlix wrap. Skin tear to RLE also covered with non-adherent dressing, abd pad and kerlix. Secured with stretch net and tape. Pt's hair cleansed with NS. Small lac 0.3cm in length noted to top of head. Site cleansed, dry gauze placed over site, secured with stretch net. Bleeding controlled to all sites. Pt states relief of pain after RLE lancing.

## 2016-07-13 DIAGNOSIS — Z87891 Personal history of nicotine dependence: Secondary | ICD-10-CM | POA: Diagnosis not present

## 2016-07-13 DIAGNOSIS — W1830XA Fall on same level, unspecified, initial encounter: Secondary | ICD-10-CM | POA: Diagnosis present

## 2016-07-13 DIAGNOSIS — S8011XA Contusion of right lower leg, initial encounter: Secondary | ICD-10-CM | POA: Diagnosis not present

## 2016-07-13 DIAGNOSIS — N179 Acute kidney failure, unspecified: Secondary | ICD-10-CM | POA: Diagnosis present

## 2016-07-13 DIAGNOSIS — S81811A Laceration without foreign body, right lower leg, initial encounter: Secondary | ICD-10-CM | POA: Diagnosis not present

## 2016-07-13 DIAGNOSIS — M79604 Pain in right leg: Secondary | ICD-10-CM | POA: Diagnosis present

## 2016-07-13 DIAGNOSIS — S0101XA Laceration without foreign body of scalp, initial encounter: Secondary | ICD-10-CM | POA: Diagnosis present

## 2016-07-13 DIAGNOSIS — H9190 Unspecified hearing loss, unspecified ear: Secondary | ICD-10-CM | POA: Diagnosis present

## 2016-07-13 DIAGNOSIS — Z96643 Presence of artificial hip joint, bilateral: Secondary | ICD-10-CM | POA: Diagnosis present

## 2016-07-13 DIAGNOSIS — D62 Acute posthemorrhagic anemia: Secondary | ICD-10-CM | POA: Diagnosis present

## 2016-07-13 DIAGNOSIS — I482 Chronic atrial fibrillation: Secondary | ICD-10-CM | POA: Diagnosis present

## 2016-07-13 DIAGNOSIS — Y92009 Unspecified place in unspecified non-institutional (private) residence as the place of occurrence of the external cause: Secondary | ICD-10-CM | POA: Diagnosis not present

## 2016-07-13 DIAGNOSIS — F0390 Unspecified dementia without behavioral disturbance: Secondary | ICD-10-CM | POA: Diagnosis present

## 2016-07-13 DIAGNOSIS — I129 Hypertensive chronic kidney disease with stage 1 through stage 4 chronic kidney disease, or unspecified chronic kidney disease: Secondary | ICD-10-CM | POA: Diagnosis present

## 2016-07-13 DIAGNOSIS — S0990XA Unspecified injury of head, initial encounter: Secondary | ICD-10-CM | POA: Diagnosis present

## 2016-07-13 DIAGNOSIS — N183 Chronic kidney disease, stage 3 (moderate): Secondary | ICD-10-CM | POA: Diagnosis present

## 2016-07-13 DIAGNOSIS — Z7902 Long term (current) use of antithrombotics/antiplatelets: Secondary | ICD-10-CM | POA: Diagnosis not present

## 2016-07-13 DIAGNOSIS — Z885 Allergy status to narcotic agent status: Secondary | ICD-10-CM | POA: Diagnosis not present

## 2016-07-13 DIAGNOSIS — Z96612 Presence of left artificial shoulder joint: Secondary | ICD-10-CM | POA: Diagnosis present

## 2016-07-13 DIAGNOSIS — R52 Pain, unspecified: Secondary | ICD-10-CM | POA: Diagnosis present

## 2016-07-13 DIAGNOSIS — E785 Hyperlipidemia, unspecified: Secondary | ICD-10-CM | POA: Diagnosis present

## 2016-07-13 DIAGNOSIS — E039 Hypothyroidism, unspecified: Secondary | ICD-10-CM | POA: Diagnosis present

## 2016-07-13 DIAGNOSIS — E875 Hyperkalemia: Secondary | ICD-10-CM | POA: Diagnosis not present

## 2016-07-13 DIAGNOSIS — Z8673 Personal history of transient ischemic attack (TIA), and cerebral infarction without residual deficits: Secondary | ICD-10-CM | POA: Diagnosis not present

## 2016-07-13 DIAGNOSIS — K219 Gastro-esophageal reflux disease without esophagitis: Secondary | ICD-10-CM | POA: Diagnosis present

## 2016-07-13 DIAGNOSIS — D631 Anemia in chronic kidney disease: Secondary | ICD-10-CM | POA: Diagnosis present

## 2016-07-13 DIAGNOSIS — Z96611 Presence of right artificial shoulder joint: Secondary | ICD-10-CM | POA: Diagnosis present

## 2016-07-13 DIAGNOSIS — Z90710 Acquired absence of both cervix and uterus: Secondary | ICD-10-CM | POA: Diagnosis not present

## 2016-07-13 DIAGNOSIS — Z7982 Long term (current) use of aspirin: Secondary | ICD-10-CM | POA: Diagnosis not present

## 2016-07-13 LAB — BASIC METABOLIC PANEL
Anion gap: 8 (ref 5–15)
BUN: 47 mg/dL — ABNORMAL HIGH (ref 6–20)
CALCIUM: 8.4 mg/dL — AB (ref 8.9–10.3)
CO2: 28 mmol/L (ref 22–32)
CREATININE: 1.9 mg/dL — AB (ref 0.44–1.00)
Chloride: 104 mmol/L (ref 101–111)
GFR calc Af Amer: 26 mL/min — ABNORMAL LOW (ref >60)
GFR calc non Af Amer: 22 mL/min — ABNORMAL LOW (ref >60)
GLUCOSE: 114 mg/dL — AB (ref 65–99)
Potassium: 4.5 mmol/L (ref 3.5–5.1)
Sodium: 140 mmol/L (ref 135–145)

## 2016-07-13 LAB — CBC
HCT: 30.6 % — ABNORMAL LOW (ref 35.0–47.0)
HEMOGLOBIN: 10.1 g/dL — AB (ref 12.0–16.0)
MCH: 31.7 pg (ref 26.0–34.0)
MCHC: 33 g/dL (ref 32.0–36.0)
MCV: 96 fL (ref 80.0–100.0)
PLATELETS: 161 10*3/uL (ref 150–440)
RBC: 3.18 MIL/uL — ABNORMAL LOW (ref 3.80–5.20)
RDW: 13.7 % (ref 11.5–14.5)
WBC: 15.9 10*3/uL — ABNORMAL HIGH (ref 3.6–11.0)

## 2016-07-13 LAB — CK: CK TOTAL: 77 U/L (ref 38–234)

## 2016-07-13 LAB — HEMOGLOBIN: Hemoglobin: 9.5 g/dL — ABNORMAL LOW (ref 12.0–16.0)

## 2016-07-13 MED ORDER — ASPIRIN EC 81 MG PO TBEC
81.0000 mg | DELAYED_RELEASE_TABLET | Freq: Every day | ORAL | Status: DC
  Administered 2016-07-13: 81 mg via ORAL
  Filled 2016-07-13: qty 1

## 2016-07-13 MED ORDER — HEPARIN SODIUM (PORCINE) 5000 UNIT/ML IJ SOLN
5000.0000 [IU] | Freq: Two times a day (BID) | INTRAMUSCULAR | Status: DC
  Administered 2016-07-13: 5000 [IU] via SUBCUTANEOUS
  Filled 2016-07-13 (×2): qty 1

## 2016-07-13 MED ORDER — ACETAMINOPHEN 650 MG RE SUPP: 650.0000 mg | Freq: Four times a day (QID) | RECTAL | Status: DC | PRN

## 2016-07-13 MED ORDER — VITAMIN D 1000 UNITS PO TABS
2000.0000 [IU] | ORAL_TABLET | Freq: Every day | ORAL | Status: DC
  Administered 2016-07-13 – 2016-07-14 (×2): 2000 [IU] via ORAL
  Filled 2016-07-13 (×3): qty 2

## 2016-07-13 MED ORDER — SODIUM CHLORIDE 0.9 % IV SOLN
INTRAVENOUS | Status: DC
  Administered 2016-07-13 – 2016-07-14 (×3): via INTRAVENOUS

## 2016-07-13 MED ORDER — MAGNESIUM CITRATE PO SOLN: 1.0000 | Freq: Once | ORAL | Status: DC | PRN

## 2016-07-13 MED ORDER — OXYCODONE HCL 5 MG PO TABS
5.0000 mg | ORAL_TABLET | Freq: Four times a day (QID) | ORAL | Status: DC | PRN
  Administered 2016-07-13 – 2016-07-14 (×3): 5 mg via ORAL
  Filled 2016-07-13 (×3): qty 1

## 2016-07-13 MED ORDER — ACETAMINOPHEN 325 MG PO TABS: 650.0000 mg | ORAL_TABLET | Freq: Four times a day (QID) | ORAL | Status: DC | PRN

## 2016-07-13 MED ORDER — LEVOTHYROXINE SODIUM 100 MCG PO TABS
100.0000 ug | ORAL_TABLET | Freq: Every day | ORAL | Status: DC
  Administered 2016-07-13: 100 ug via ORAL
  Filled 2016-07-13 (×3): qty 1

## 2016-07-13 MED ORDER — PANTOPRAZOLE SODIUM 40 MG PO TBEC
40.0000 mg | DELAYED_RELEASE_TABLET | Freq: Every day | ORAL | Status: DC
Start: 2016-07-13 — End: 2016-07-15
  Administered 2016-07-13 – 2016-07-14 (×2): 40 mg via ORAL
  Filled 2016-07-13 (×3): qty 1

## 2016-07-13 MED ORDER — ATORVASTATIN CALCIUM 20 MG PO TABS
40.0000 mg | ORAL_TABLET | Freq: Every day | ORAL | Status: DC
  Administered 2016-07-13: 40 mg via ORAL
  Filled 2016-07-13 (×2): qty 2

## 2016-07-13 MED ORDER — ONDANSETRON HCL 4 MG/2ML IJ SOLN: 4.0000 mg | Freq: Four times a day (QID) | INTRAMUSCULAR | Status: DC | PRN

## 2016-07-13 MED ORDER — ONDANSETRON HCL 4 MG PO TABS: 4.0000 mg | ORAL_TABLET | Freq: Four times a day (QID) | ORAL | Status: DC | PRN

## 2016-07-13 MED ORDER — FUROSEMIDE 20 MG PO TABS
20.0000 mg | ORAL_TABLET | Freq: Every day | ORAL | Status: DC
  Administered 2016-07-13 – 2016-07-14 (×2): 20 mg via ORAL
  Filled 2016-07-13 (×2): qty 1

## 2016-07-13 MED ORDER — SODIUM CHLORIDE 0.9 % IV BOLUS (SEPSIS)
500.0000 mL | Freq: Once | INTRAVENOUS | Status: AC
Start: 2016-07-13 — End: 2016-07-13
  Administered 2016-07-13: 500 mL via INTRAVENOUS

## 2016-07-13 MED ORDER — SENNOSIDES-DOCUSATE SODIUM 8.6-50 MG PO TABS: 1.0000 | ORAL_TABLET | Freq: Every evening | ORAL | Status: DC | PRN

## 2016-07-13 MED ORDER — OXYCODONE HCL 5 MG PO TABS: 5.0000 mg | ORAL_TABLET | Freq: Four times a day (QID) | ORAL | 0 refills | Status: AC | PRN

## 2016-07-13 MED ORDER — ENOXAPARIN SODIUM 40 MG/0.4ML ~~LOC~~ SOLN: 40.0000 mg | SUBCUTANEOUS | Status: DC

## 2016-07-13 MED ORDER — BISACODYL 5 MG PO TBEC: 5.0000 mg | DELAYED_RELEASE_TABLET | Freq: Every day | ORAL | Status: DC | PRN

## 2016-07-13 MED ORDER — SODIUM CHLORIDE 0.9% FLUSH
3.0000 mL | Freq: Two times a day (BID) | INTRAVENOUS | Status: DC
  Administered 2016-07-13 – 2016-07-15 (×3): 3 mL via INTRAVENOUS

## 2016-07-13 MED ORDER — CLOPIDOGREL BISULFATE 75 MG PO TABS
75.0000 mg | ORAL_TABLET | Freq: Every day | ORAL | Status: DC
  Administered 2016-07-13: 75 mg via ORAL
  Filled 2016-07-13: qty 1

## 2016-07-13 NOTE — Plan of Care (Signed)
Problem: Safety: Goal: Ability to remain free from injury will improve Outcome: Not Progressing New admission   Comments: New admission

## 2016-07-13 NOTE — Progress Notes (Signed)
PT Cancellation Note  Patient Details Name: Elizabeth GrateJean Scott Ramella MRN: [id number] DOB: July 12, 1926   Cancelled Treatment:    Reason Eval/Treat Not Completed: Medical issues which prohibited therapy. Chart reviewed. Attempted to evaluate pt however upon arrival pt on room air and SaO2 at 76%. Called RN who watched sats for extended time as well and pt remains in the upper 70's until supplemental O2 applied. Pt requires 3 L/min initially to bring up sats however then tapered down to 2L/min. Attempted room air again and SaO2 drops to mid/upper 80's. HR between 100-130 bpm. RN paged MD who reports he will come assess patient. Will hold patient currently and attempt PT evaluation when medically appropriate.  Sharalyn InkJason D Xia Stohr PT, DPT   Treyven Lafauci 07/13/2016, 9:48 AM

## 2016-07-13 NOTE — Progress Notes (Addendum)
Pharmacist - Prescriber Communication  Lovenox has been changed to heparin due to low creatinine clearance and body weight.  Daysha Ashmore A. [place], [place].D., BCPS Clinical Pharmacist 07/13/2016 [phone]

## 2016-07-13 NOTE — H&P (Signed)
[facility] - [place] @ [facility] Admission History and Physical [name], D.O.  ---------------------------------------------------------------------------------------------------------------------   PATIENT NAME: Elizabeth Horne MR#: [id number] DATE OF BIRTH: Jan 24, 1926 DATE OF ADMISSION: 07/27/16 PRIMARY CARE PHYSICIAN: Marikay Alar, MD  REQUESTING/REFERRING PHYSICIAN: ED Dr. Huel Cote  CHIEF COMPLAINT: Chief Complaint  Patient presents with  . Fall  . Head Injury  . Hip Pain  . Leg Pain    HISTORY OF PRESENT ILLNESS: Elizabeth Horne is a 82 y.o. female with a known history of afib, HTN, HLD, hypothyroidism, subdural hematoma  presents to the emergency department complaining of mechanical fall.  Patient was in a usual state of health until this afternoon when she states she "just fell."  She denies dizziness, lightheadedness, shortness of breath, weakness, chest pain, loss of consciousness.  Patient states that she did hit her head as well as her right elbow and hip.  In the annulus patient received fentanyl with a brief period of apnea. She is then receive Narcan which corrected her respirations. In the emergency department patient was noted to have a right lower leg hematoma which was incised and drained by emergency department physician. At present her only complaint is leg pain.  Of note patient lives independently but does not drive. She states that her sons live nearby and check on her frequently.  Otherwise there has been no change in status. Patient has been taking medication as prescribed and there has been no recent change in medication or diet.  There has been no recent illness, travel or sick contacts.    Patient denies fevers/chills, weakness, dizziness, chest pain, shortness of breath, N/V/C/D, abdominal pain, dysuria/frequency, changes in mental status.   PAST MEDICAL HISTORY: Past Medical History:  Diagnosis Date  . A-fib (HCC)   . Arthritis   .  Hyperlipidemia   . Hypertension   . Subdural hematoma (HCC)   . Thyroid disease       PAST SURGICAL HISTORY: Past Surgical History:  Procedure Laterality Date  . ABDOMINAL HYSTERECTOMY     menorrhagia  . CRANIOTOMY    . JOINT REPLACEMENT    . KYPHOPLASTY  2015   x 2, Dr. Rosita Kea  . THYROIDECTOMY, PARTIAL     benign  . TONSILLECTOMY AND ADENOIDECTOMY    . TOTAL HIP ARTHROPLASTY Bilateral   . TOTAL SHOULDER ARTHROPLASTY Bilateral   . VAGINAL DELIVERY     2      SOCIAL HISTORY: Social History  Substance Use Topics  . Smoking status: Former Smoker    Types: Cigarettes    Quit date: 06/09/1987  . Smokeless tobacco: Never Used  . Alcohol use 0.6 oz/week    1 Glasses of wine per week     Comment: daily glass of wine or 2      FAMILY HISTORY: Family History  Problem Relation Age of Onset  . Lung disease Sister      MEDICATIONS AT HOME: Prior to Admission medications   Medication Sig Start Date End Date Taking? Authorizing Provider  aspirin 81 MG tablet Take 81 mg by mouth daily.   Yes Historical Provider, MD  atorvastatin (LIPITOR) 40 MG tablet TAKE ONE TABLET BY MOUTH EVERY DAY AT 6 IN THE EVENING 05/01/16  Yes Glori Luis, MD  Cholecalciferol (VITAMIN D3) 2000 units capsule Take 1 capsule (2,000 Units total) by mouth daily. 09/08/15  Yes Shelia Media, MD  clopidogrel (PLAVIX) 75 MG tablet TAKE ONE TABLET BY MOUTH EVERY DAY 05/01/16  Yes Glori Luis,  MD  cyanocobalamin (,VITAMIN B-12,) 1000 MCG/ML injection INJECT 1 ML IM EACH MONTH 09/07/14  Yes Shelia MediaJennifer A Walker, MD  furosemide (LASIX) 20 MG tablet Take by mouth. 08/31/15 08/30/16 Yes Historical Provider, MD  levothyroxine (SYNTHROID, LEVOTHROID) 75 MCG tablet Take 1.5 tablets (112.5 mcg total) by mouth daily before breakfast. 03/12/16  Yes Shelia MediaJennifer A Walker, MD  omeprazole (PRILOSEC) 20 MG capsule TAKE 1 CAPSULE BY MOUTH EVERY DAY 01/25/16  Yes Shelia MediaJennifer A Walker, MD      DRUG ALLERGIES: Allergies   Allergen Reactions  . Tramadol Nausea Only     REVIEW OF SYSTEMS: CONSTITUTIONAL: No fatigue, weakness, fever, chills, weight gain/loss, headache EYES: No blurry or double vision. ENT: No tinnitus, postnasal drip, redness or soreness of the oropharynx. RESPIRATORY: No dyspnea, cough, wheeze, hemoptysis. CARDIOVASCULAR: No chest pain, orthopnea, palpitations, syncope. GASTROINTESTINAL: No nausea, vomiting, constipation, diarrhea, abdominal pain. No hematemesis, melena or hematochezia. GENITOURINARY: No dysuria, frequency, hematuria. ENDOCRINE: No polyuria or nocturia. No heat or cold intolerance. HEMATOLOGY: No anemia, bruising, bleeding. INTEGUMENTARY: No rashes, ulcers, lesions. MUSCULOSKELETAL: No pain, arthritis, swelling, gout. positive right leg pain  NEUROLOGIC: No numbness, tingling, weakness or ataxia. No seizure-type activity. PSYCHIATRIC: No anxiety, depression, insomnia.  PHYSICAL EXAMINATION: VITAL SIGNS: Blood pressure 95/80, pulse 69, temperature 97.5 F (36.4 C), temperature source Oral, resp. rate 17, weight 40.8 kg (90 lb), SpO2 100 %.  GENERAL: 82 y.o.-yethin white female patient, well-developed, well-nourished lying in the bed in no acute distress.  Pleasant and cooperative.   HEENT: Head atraumatic, normocephalic. Pupils equal, round, reactive to light and accommodation. No scleral icterus. Extraocular muscles intact. Oropharynx is clear. Mucus membranes moist. NECK: Supple, full range of motion. No JVD, no bruit heard. No cervical lymphadenopathy. CHEST: Normal breath sounds bilaterally. No wheezing, rales, rhonchi or crackles. No use of accessory muscles of respiration.  No reproducible chest wall tenderness.  CARDIOVASCULAR: S1, S2 normal. No murmurs, rubs, or gallops appreciated. Cap refill <2 seconds. ABDOMEN: Soft, nontender, nondistended. No rebound, guarding, rigidity. Normoactive bowel sounds present in all four quadrants. No organomegaly or  mass. EXTREMITIES:  right lower extremity wound is dressed NEUROLOGIC: Cranial nerves II through XII are grossly intact with no focal sensorimotor deficit. Muscle strength 5/5 in all extremities. Sensation intact. Gait not checked. PSYCHIATRIC: The patient is alert and oriented x 3. Normal affect, mood, thought content. SKIN: Warm, dry, and intact without obvious rash, lesion, or ulcer.  LABORATORY PANEL:  CBC  Recent Labs Lab 07/20/2016 2213  WBC 17.9*  HGB 11.6*  HCT 35.4  PLT 189   ----------------------------------------------------------------------------------------------------------------- Chemistries  Recent Labs Lab 07/13/2016 2150  NA 139  K 4.2  CL 103  CO2 24  GLUCOSE 110*  BUN 43*  CREATININE 1.59*  CALCIUM 8.7*  AST 38  ALT 28  ALKPHOS 99  BILITOT 0.5   ------------------------------------------------------------------------------------------------------------------ Cardiac Enzymes No results for input(s): TROPONINI in the last 168 hours. ------------------------------------------------------------------------------------------------------------------  RADIOLOGY: Dg Elbow Complete Right  Result Date: 07/13/2016 CLINICAL DATA:  Larey SeatFell tonight at home. Pain in the right elbow, right hip, and right lower leg. Abrasions to the right lower leg. EXAM: RIGHT ELBOW - COMPLETE 3+ VIEW COMPARISON:  None. FINDINGS: Diffuse bone demineralization. No evidence of acute fracture or dislocation. No focal bone lesion or bone destruction. No effusions. Narrowed irregularity demonstrated at the midshaft right humerus at the upper edge of the image corresponding to the distal aspect of the shoulder arthroplasty component. Soft tissues are unremarkable. IMPRESSION: No acute bony abnormalities. Electronically  Signed   By: Burman NievesWilliam  Stevens M.D.   On: Oct 04, 2015 21:50   Dg Tibia/fibula Right  Result Date: 07/19/2016 CLINICAL DATA:  Patient fell with right leg pain and abrasions EXAM:  RIGHT TIBIA AND FIBULA - 2 VIEW COMPARISON:  Right knee radiographs from 03/03/2014 FINDINGS: Soft tissue masslike abnormality suspicious for hematoma measuring approximately 7.6 x 3.3 x 5.4 cm along the medial and posterior aspect of the right calf. No acute fracture nor bone destruction. 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 Chondrocalcinosis of hyaline cartilage is seen. IMPRESSION: Soft tissue masslike abnormality in the proximal calf posteromedially measuring 7.6 x 3.3 x 5.4 cm. This suspicious for hematoma. No underlying fracture is seen. There is osteoarthritis of the knee joint. Electronically Signed   By: Tollie Ethavid  Kwon M.D.   On: Oct 04, 2015 21:52   Ct Head Wo Contrast  Result Date: 07/23/2016 CLINICAL DATA:  Patient presents to the ED via EMS from home. Patient reports that she fell tonight citing the fact that she "just crumped over". Presents tonight with c/o pain in her head, RIGHT elbow, RIGHT hip, and RIGHT tib/fib. Bruising noted to the right frontal area. History of subdural hematoma and craniotomy. EXAM: CT HEAD WITHOUT CONTRAST TECHNIQUE: Contiguous axial images were obtained from the base of the skull through the vertex without intravenous contrast. COMPARISON:  03/21/2016 FINDINGS: Brain: There is significant central cortical atrophy. Periventricular white matter changes are consistent with small vessel disease. There is no intra or extra-axial fluid collection or mass lesion. The basilar cisterns and ventricles have a normal appearance. There is no CT evidence for acute infarction or hemorrhage. Vascular: There is atherosclerotic calcification of the internal carotid arteries. Skull: Left frontal and left parietal burr holes. No acute fracture. Sinuses/Orbits: No acute finding. Other: Right frontal scalp edema/hematoma.  No underlying fracture. IMPRESSION: 1. Atrophy and small vessel disease. 2.  No evidence for acute intracranial abnormality. 3. Right frontal scalp  edema/hematoma without underlying fracture. Electronically Signed   By: Norva PavlovElizabeth  Brown M.D.   On: Oct 04, 2015 21:18   Dg Hip Unilat W Or Wo Pelvis 2-3 Views Right  Result Date: 07/05/2016 CLINICAL DATA:  Right hip pain after a fall tonight. EXAM: DG HIP (WITH OR WITHOUT PELVIS) 2-3V RIGHT COMPARISON:  CT abdomen and pelvis 11/07/2015.  Pelvis 03/03/2014 FINDINGS: Postoperative changes with bilateral total hip arthroplasties. The most distal portion of the left hip arthroplasty is not included within the field of view. Visualize components appear well seated. No component dislocation. Diffuse bone demineralization. Old healed fracture deformities of the superior and inferior pubic rami bilaterally. 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 No evidence of acute fracture or dislocation. Visualized sacrum appears intact. Postoperative changes consistent with hernia repair on the left. IMPRESSION: Bilateral total hip arthroplasties. Old pubic ramus fracture deformities bilaterally. No acute bony abnormalities. Electronically Signed   By: Burman NievesWilliam  Stevens M.D.   On: Oct 04, 2015 21:54    EKG: Afib at 77 bpm with normal axis and nonspecific ST-T wave changes.   IMPRESSION AND PLAN:  This is a 82 y.o. female with a history of afib, HTN, HLD, hypothyroidism, subdural hematoma now being admitted with: 1. Intractable right leg pain secondary to hematoma status post mechanical fall. We'll admit patient for telemetry monitoring, pain control, physical therapy evaluation in a.m. Regarding the hematoma I requested a surgical consultation for comanagement. We'll monitor CBC. 2. AKI -gentle IV fluid hydration and recheck BMP in the morning 3.  history of hyperlipidemia-continue Lipitor  4. History of hypertension-continue Lasix 5. History of hypothyroidism-continue Synthroid 6. History of GERD-continue Prilosec 7. History of atrial fibrillation-rate controlled. Monitor on telemetry. Patient is currently  not on any rate control medications. She is on aspirin and Plavix which we will continue for now as a monitor hematoma.   Diet/Nutrition:  heart healthy Fluids:  normal saline DVT Px: SCD to LLE and early ambulation Code Status: Full  All the records are reviewed and case discussed with ED provider. Management plans discussed with the patient and/or family who express understanding and agree with plan of care.   TOTAL TIME TAKING CARE OF THIS PATIENT: 60 minutes.   Naveya Ellerman D.O. on 07/13/2016 at 2:14 AM Between 7am to 6pm - Pager - [phone] After 6pm go to [email or web address] - Social research officer, government [facility] [place] [facility] Office [phone] CC: Primary care physician; Marikay Alar, MD     Note: This dictation was prepared with Dragon dictation along with smaller phrase technology. Any transcriptional errors that result from this process are unintentional.

## 2016-07-13 NOTE — Progress Notes (Signed)
Patient refuses scd's and foot pumps because she states it hurts too much.  Patient educated on dvt prevention

## 2016-07-13 NOTE — Progress Notes (Signed)
CCMD notified RN that patient HR in the 140's. Normal saline bolus ordered.   Harvie HeckMelanie Ardath Lepak, RN

## 2016-07-13 NOTE — Clinical Social Work Placement (Signed)
   CLINICAL SOCIAL WORK PLACEMENT  NOTE  Date:  07/13/2016  Patient Details  Name: Elizabeth Horne MRN: [id number] Date of Birth: 03/03/1926  Clinical Social Work is seeking post-discharge placement for this patient at the Skilled  Nursing Facility level of care (*CSW will initial, date and re-position this form in  chart as items are completed):  Yes   Patient/family provided with [facility] Clinical Social Work Department's list of facilities offering this level of care within the geographic area requested by the patient (or if unable, by the patient's family).  Yes   Patient/family informed of their freedom to choose among providers that offer the needed level of care, that participate in Medicare, Medicaid or managed care program needed by the patient, have an available bed and are willing to accept the patient.  Yes   Patient/family informed of [facility]'s ownership interest [facility] and [facility], as well as of the fact that they are under no obligation to receive care at these facilities.  PASRR submitted to EDS on 07/13/16     PASRR number received on 07/13/16     Existing PASRR number confirmed on       FL2 transmitted to all facilities in geographic area requested by pt/family on 07/13/16     FL2 transmitted to all facilities within larger geographic area on       Patient informed that his/her managed care company has contracts with or will negotiate with certain facilities, including the following:        Yes   Patient/family informed of bed offers received.  Patient chooses bed at  [facility]     Physician recommends and patient chooses bed at      Patient to be transferred to   on  .  Patient to be transferred to facility by       Patient family notified on   of transfer.  Name of family member notified:        PHYSICIAN       Additional Comment:    _______________________________________________ [name], [name],  LCSW 07/13/2016, 12:59 PM

## 2016-07-13 NOTE — Progress Notes (Signed)
Patient oxygen saturation 77% on room air. RN placed patient on 2L of O2. HR 100-136. MD made aware and will be rounding on the patient soon. No new orders at this time.  Harvie HeckMelanie Victoriano Campion, RN

## 2016-07-13 NOTE — Clinical Social Work Note (Addendum)
Clinical Social Work Assessment  Patient Details  Name: Natashia Roseman MRN: [id number] Date of Birth: 23-Jul-1926  Date of referral:  07/13/16               Reason for consult:  Facility Placement                Permission sought to share information with:  Chartered certified accountant granted to share information::  Yes, Verbal Permission Granted  Name::      [place]::     Relationship::     Contact Information:     Housing/Transportation Living arrangements for the past 2 months:  [place] of Information:  Patient, Adult Children Patient Interpreter Needed:  None Criminal Activity/Legal Involvement Pertinent to Current Situation/Hospitalization:  No - Comment as needed Significant Relationships:  Adult Children Lives with:  Self Do you feel safe going back to the place where you live?  Yes Need for family participation in patient care:  Yes (Comment)  Care giving concerns:  Patient lives in a [place] in [place] alone and has caregivers come in.    Social Worker assessment / plan:  Holiday representative (CSW) received SNF consult. PT was not able to work with patient today because of her oxygen level. CSW met with patient and her son Liliane Channel [phone] and daughter Lattie Haw [phone] were at bedside. CSW introduced self and explained role of CSW department. Patient reported that she lives in a condo alone and has caregivers Monday through Friday 11 am to 4 pm and sometimes on the weekends. Son asked about SNF placement. CSW explained that PT will make a recommendation of home health or SNF. Son is anticipating SNF because patient is very weak and having difficulty ambulating. Patient and son are agreeable to SNF search. Patient prefers [facility]   Per MD patient will be ready for D/C tomorrow. [facility] can accept patient tomorrow room 210-B. RN will call report at [phone]. CSW sent D/C Summary to  [facility] today via [place]. Patient and her son and daughter are aware of above. CSW will continue to follow and assist as needed.   Employment status:  Retired Nurse, adult PT Recommendations:  [place] / Referral to community resources:  [place]  Patient/Family's Response to care:  Patient and son are agreeable for patient to go to [facility].   Patient/Family's Understanding of and Emotional Response to Diagnosis, Current Treatment, and Prognosis:  Patient and son were pleasant and thanked CSW for assistance.   Emotional Assessment Appearance:  Appears stated age Attitude/Demeanor/Rapport:    Affect (typically observed):  Accepting, Adaptable, Pleasant Orientation:  Oriented to Self, Oriented to Place, Oriented to  Time, Oriented to Situation Alcohol / Substance use:  Not Applicable Psych involvement (Current and /or in the community):  No (Comment)  Discharge Needs  Concerns to be addressed:  Discharge Planning Concerns Readmission within the last 30 days:  No Current discharge risk:  Dependent with Mobility Barriers to Discharge:  Continued Medical Work up   [employer], Veronia Beets, LCSW 07/13/2016, 2:59 PM

## 2016-07-13 NOTE — Discharge Summary (Signed)
[facility] - [place] at [facility]   PATIENT NAME: Elizabeth Horne    MR#:  [id number]  DATE OF BIRTH:  1925/11/13  DATE OF ADMISSION:  08/02/16 ADMITTING PHYSICIAN: [name], DO  DATE OF DISCHARGE: 07/13/2016  PRIMARY CARE PHYSICIAN: Marikay Alar, MD   ADMISSION DIAGNOSIS:  Right leg pain [M79.604] Injury of head, initial encounter [S09.90XA]  DISCHARGE DIAGNOSIS:  Active Problems:   Intractable pain  SECONDARY DIAGNOSIS:   Past Medical History:  Diagnosis Date  . A-fib (HCC)   . Arthritis   . Hyperlipidemia   . Hypertension   . Subdural hematoma (HCC)   . Thyroid disease      ADMITTING HISTORY  HISTORY OF PRESENT ILLNESS: [name] is a 82 y.o. female with a known history of afib, HTN, HLD, hypothyroidism, subdural hematoma    presents to the emergency department complaining of mechanical fall.  Patient was in a usual state of health until this afternoon when she states she "just fell."  She denies dizziness, lightheadedness, shortness of breath, weakness, chest pain, loss of consciousness.  Patient states that she did hit her head as well as her right elbow and hip.  In the annulus patient received fentanyl with a brief period of apnea. She is then receive Narcan which corrected her respirations. In the emergency department patient was noted to have a right lower leg hematoma which was incised and drained by emergency department physician. At present her only complaint is leg pain.  Of note patient lives independently but does not drive. She states that her sons live nearby and check on her frequently.  Otherwise there has been no change in status. Patient has been taking medication as prescribed and there has been no recent change in medication or diet.  There has been no recent illness, travel or sick contacts.    Patient denies fevers/chills, weakness, dizziness, chest pain, shortness of breath, N/V/C/D, abdominal pain, dysuria/frequency,  changes in mental status.   HOSPITAL COURSE:   This is a 82 y.o.femalewith a history of afib, HTN, HLD, hypothyroidism, subdural hematomanow being admitted with  1. Right leg pain  hematoma status post mechanical fall.  Drop in Hemoglobin initially InD done in ED. Compression dressing. Bleeding seems to have stopped. Acute blood loss anemia. No need for transfusion  Patient can be discharged to skilled nursing facility tomorrow there is no further bleeding and hemoglobin stable.  2. AKI -gentle IV fluid hydration  CK levels normal  3.  hyperlipidemia-continue Lipitor  4. hypertension-continue Lasix  5.  hypothyroidism-continue Synthroid  6. GERD-continue Prilosec  7. Atrial fibrillation Rate controlled. Monitor on telemetry. Aspirin and Plavix held. Can be restarted in 3 days after discharge if no further bleeding noticed.  CONSULTS OBTAINED:  Treatment Team:  Gladis Riffle, MD  DRUG ALLERGIES:   Allergies  Allergen Reactions  . Tramadol Nausea Only    DISCHARGE MEDICATIONS:   Current Discharge Medication List    START taking these medications   Details  oxyCODONE (ROXICODONE) 5 MG immediate release tablet Take 1 tablet (5 mg total) by mouth every 6 (six) hours as needed for severe pain. Qty: 15 tablet, Refills: 0      CONTINUE these medications which have NOT CHANGED   Details  aspirin 81 MG tablet Take 81 mg by mouth daily.    atorvastatin (LIPITOR) 40 MG tablet TAKE ONE TABLET BY MOUTH EVERY DAY AT 6 IN THE EVENING Qty: 90 tablet, Refills: 3    Cholecalciferol (  VITAMIN D3) 2000 units capsule Take 1 capsule (2,000 Units total) by mouth daily. Qty: 30 capsule, Refills: 0    clopidogrel (PLAVIX) 75 MG tablet TAKE ONE TABLET BY MOUTH EVERY DAY Qty: 90 tablet, Refills: 3    cyanocobalamin (,VITAMIN B-12,) 1000 MCG/ML injection INJECT 1 ML IM EACH MONTH Qty: 30 mL, Refills: 11    furosemide (LASIX) 20 MG tablet Take by mouth.     levothyroxine (SYNTHROID, LEVOTHROID) 75 MCG tablet Take 1.5 tablets (112.5 mcg total) by mouth daily before breakfast. Qty: 90 tablet, Refills: 2    omeprazole (PRILOSEC) 20 MG capsule TAKE 1 CAPSULE BY MOUTH EVERY DAY Qty: 90 capsule, Refills: 2        Today   VITAL SIGNS:  Blood pressure 107/75, pulse 91, temperature 97.9 F (36.6 C), temperature source Oral, resp. rate 18, height 5\' 2"  (1.575 m), weight 46.8 kg (103 lb 1.6 oz), SpO2 99 %.  I/O:   Intake/Output Summary (Last 24 hours) at 07/13/16 1204 Last data filed at 07/13/16 0900  Gross per 24 hour  Intake           758.75 ml  Output              100 ml  Net           658.75 ml    PHYSICAL EXAMINATION:  Physical Exam  GENERAL:  82 y.o.-year-old patient lying in the bed with no acute distress.  LUNGS: Normal breath sounds bilaterally, no wheezing, rales,rhonchi or crepitation. No use of accessory muscles of respiration.  CARDIOVASCULAR: S1, S2 normal. No murmurs, rubs, or gallops.  ABDOMEN: Soft, non-tender, non-distended. Bowel sounds present. No organomegaly or mass.  NEUROLOGIC: Moves all 4 extremities. PSYCHIATRIC: The patient is alert and awake SKIN: No obvious rash, lesion, or ulcer.   Right leg dressing  DATA REVIEW:   CBC  Recent Labs Lab 07/13/16 0325  WBC 15.9*  HGB 10.1*  HCT 30.6*  PLT 161    Chemistries   Recent Labs Lab 2016/08/04 2150 07/13/16 0325  NA 139 140  K 4.2 4.5  CL 103 104  CO2 24 28  GLUCOSE 110* 114*  BUN 43* 47*  CREATININE 1.59* 1.90*  CALCIUM 8.7* 8.4*  AST 38  --   ALT 28  --   ALKPHOS 99  --   BILITOT 0.5  --     Cardiac Enzymes No results for input(s): TROPONINI in the last 168 hours.  Microbiology Results  Results for orders placed or performed in visit on 12/18/11  Urine culture     Status: None   Collection Time: 12/18/11  9:10 PM  Result Value Ref Range Status   Micro Text Report   Final       SOURCE: CLEAN CATCH    COMMENT                    NO GROWTH IN 36 HOURS   ANTIBIOTIC                                                        RADIOLOGY:  Dg Elbow Complete Right  Result Date: 08/04/16 CLINICAL DATA:  Elizabeth Horne tonight at home. Pain in the right elbow, right hip, and right lower leg. Abrasions to the right lower leg. EXAM: RIGHT ELBOW -  COMPLETE 3+ VIEW COMPARISON:  None. FINDINGS: Diffuse bone demineralization. No evidence of acute fracture or dislocation. No focal bone lesion or bone destruction. No effusions. Narrowed irregularity demonstrated at the midshaft right humerus at the upper edge of the image corresponding to the distal aspect of the shoulder arthroplasty component. Soft tissues are unremarkable. IMPRESSION: No acute bony abnormalities. Electronically Signed   By: Burman NievesWilliam  Stevens M.D.   On: 03-10-16 21:50   Dg Tibia/fibula Right  Result Date: 07/06/2016 CLINICAL DATA:  Patient fell with right leg pain and abrasions EXAM: RIGHT TIBIA AND FIBULA - 2 VIEW COMPARISON:  Right knee radiographs from 03/03/2014 FINDINGS: Soft tissue masslike abnormality suspicious for hematoma measuring approximately 7.6 x 3.3 x 5.4 cm along the medial and posterior aspect of the right calf. No acute fracture nor bone destruction. 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 Chondrocalcinosis of hyaline cartilage is seen. IMPRESSION: Soft tissue masslike abnormality in the proximal calf posteromedially measuring 7.6 x 3.3 x 5.4 cm. This suspicious for hematoma. No underlying fracture is seen. There is osteoarthritis of the knee joint. Electronically Signed   By: Tollie Ethavid  Kwon M.D.   On: 03-10-16 21:52   Ct Head Wo Contrast  Result Date: 07/14/2016 CLINICAL DATA:  Patient presents to the ED via EMS from home. Patient reports that she fell tonight citing the fact that she "just crumped over". Presents tonight with c/o pain in her head, RIGHT elbow, RIGHT hip, and RIGHT tib/fib. Bruising noted to the right frontal area. History of  subdural hematoma and craniotomy. EXAM: CT HEAD WITHOUT CONTRAST TECHNIQUE: Contiguous axial images were obtained from the base of the skull through the vertex without intravenous contrast. COMPARISON:  03/21/2016 FINDINGS: Brain: There is significant central cortical atrophy. Periventricular white matter changes are consistent with small vessel disease. There is no intra or extra-axial fluid collection or mass lesion. The basilar cisterns and ventricles have a normal appearance. There is no CT evidence for acute infarction or hemorrhage. Vascular: There is atherosclerotic calcification of the internal carotid arteries. Skull: Left frontal and left parietal burr holes. No acute fracture. Sinuses/Orbits: No acute finding. Other: Right frontal scalp edema/hematoma.  No underlying fracture. IMPRESSION: 1. Atrophy and small vessel disease. 2.  No evidence for acute intracranial abnormality. 3. Right frontal scalp edema/hematoma without underlying fracture. Electronically Signed   By: Norva PavlovElizabeth  Brown M.D.   On: 03-10-16 21:18   Dg Hip Unilat W Or Wo Pelvis 2-3 Views Right  Result Date: 07/23/2016 CLINICAL DATA:  Right hip pain after a fall tonight. EXAM: DG HIP (WITH OR WITHOUT PELVIS) 2-3V RIGHT COMPARISON:  CT abdomen and pelvis 11/07/2015.  Pelvis 03/03/2014 FINDINGS: Postoperative changes with bilateral total hip arthroplasties. The most distal portion of the left hip arthroplasty is not included within the field of view. Visualize components appear well seated. No component dislocation. Diffuse bone demineralization. Old healed fracture deformities of the superior and inferior pubic rami bilaterally. 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 No evidence of acute fracture or dislocation. Visualized sacrum appears intact. Postoperative changes consistent with hernia repair on the left. IMPRESSION: Bilateral total hip arthroplasties. Old pubic ramus fracture deformities bilaterally. No acute bony  abnormalities. Electronically Signed   By: Burman NievesWilliam  Stevens M.D.   On: 03-10-16 21:54    Follow up with PCP in 1 week.  Management plans discussed with the patient, family and they are in agreement.  CODE STATUS:     Code Status Orders  Start     Ordered   07/13/16 0111  Full code  Continuous     07/13/16 0110    Code Status History    Date Active Date Inactive Code Status Order ID Comments User Context   03/21/2016  3:22 PM 03/23/2016  2:22 PM Full Code [id number]  Auburn BilberryShreyang Patel, MD ED   02/26/2015 12:19 PM 02/27/2015  2:05 PM Full Code [id number]  Gracelyn NurseJohn D Johnston, MD Inpatient    Advance Directive Documentation   Flowsheet Row Most Recent Value  Type of Advance Directive  Healthcare Power of Attorney, Living will  Pre-existing out of facility DNR order (yellow form or pink MOST form)  No data  "MOST" Form in Place?  No data      TOTAL TIME TAKING CARE OF THIS PATIENT ON DAY OF DISCHARGE: more than 30 minutes.   Milagros LollSudini, Rosmarie Esquibel R M.D on 07/13/2016 at 12:04 PM  Between 7am to 6pm - Pager - [phone]  After 6pm go to [email or web address] - password [email or web address]  [facility] [place] [facility]  Office  [phone]  CC: Primary care physician; Marikay AlarEric Sonnenberg, MD  Note: This dictation was prepared with Dragon dictation along with smaller phrase technology. Any transcriptional errors that result from this process are unintentional.

## 2016-07-13 NOTE — Progress Notes (Signed)
[facility] - [place] at [facility]   PATIENT NAME: Elizabeth Horne    MR#:  [id number]  DATE OF BIRTH:  November 02, 1925  SUBJECTIVE:  CHIEF COMPLAINT:   Chief Complaint  Patient presents with  . Fall  . Head Injury  . Hip Pain  . Leg Pain   Right leg pain Sats dropped to 77% with PT and tachycardic Family at bedside  REVIEW OF SYSTEMS:    Review of Systems  Constitutional: Negative for chills and fever.  HENT: Negative for sore throat.   Eyes: Negative for blurred vision, double vision and pain.  Respiratory: Negative for cough, hemoptysis, shortness of breath and wheezing.   Cardiovascular: Negative for chest pain, palpitations, orthopnea and leg swelling.  Gastrointestinal: Negative for abdominal pain, constipation, diarrhea, heartburn, nausea and vomiting.  Genitourinary: Negative for dysuria and hematuria.  Musculoskeletal: Positive for back pain, falls and joint pain.  Skin: Negative for rash.  Neurological: Positive for dizziness. Negative for sensory change, speech change, focal weakness and headaches.  Endo/Heme/Allergies: Does not bruise/bleed easily.  Psychiatric/Behavioral: Positive for memory loss. Negative for depression. The patient is not nervous/anxious.     DRUG ALLERGIES:   Allergies  Allergen Reactions  . Tramadol Nausea Only    VITALS:  Blood pressure 107/75, pulse 91, temperature 97.9 F (36.6 C), temperature source Oral, resp. rate 18, height 5\' 2"  (1.575 m), weight 46.8 kg (103 lb 1.6 oz), SpO2 99 %.  PHYSICAL EXAMINATION:   Physical Exam  GENERAL:  82 y.o.-year-old patient lying in the bed with no acute distress.  EYES: Pupils equal, round, reactive to light and accommodation. No scleral icterus. Extraocular muscles intact.  HEENT: Head atraumatic, normocephalic. Oropharynx and nasopharynx clear.  NECK:  Supple, no jugular venous distention. No thyroid enlargement, no tenderness.  LUNGS: Normal breath sounds bilaterally, no  wheezing, rales, rhonchi. No use of accessory muscles of respiration.  CARDIOVASCULAR: S1, S2 normal. No murmurs, rubs, or gallops.  ABDOMEN: Soft, nontender, nondistended. Bowel sounds present. No organomegaly or mass.  EXTREMITIES: No cyanosis, clubbing or edema b/l.   Dressing over right leg NEUROLOGIC: Cranial nerves II through XII are intact. No focal Motor or sensory deficits b/l.   PSYCHIATRIC: The patient is alert and awake SKIN: No obvious rash, lesion, or ulcer.   LABORATORY PANEL:   CBC  Recent Labs Lab 07/13/16 0325  WBC 15.9*  HGB 10.1*  HCT 30.6*  PLT 161   ------------------------------------------------------------------------------------------------------------------ Chemistries   Recent Labs Lab 2016-01-18 2150 07/13/16 0325  NA 139 140  K 4.2 4.5  CL 103 104  CO2 24 28  GLUCOSE 110* 114*  BUN 43* 47*  CREATININE 1.59* 1.90*  CALCIUM 8.7* 8.4*  AST 38  --   ALT 28  --   ALKPHOS 99  --   BILITOT 0.5  --    ------------------------------------------------------------------------------------------------------------------  Cardiac Enzymes No results for input(s): TROPONINI in the last 168 hours. ------------------------------------------------------------------------------------------------------------------  RADIOLOGY:  Dg Elbow Complete Right  Result Date: 07/28/2016 CLINICAL DATA:  Larey SeatFell tonight at home. Pain in the right elbow, right hip, and right lower leg. Abrasions to the right lower leg. EXAM: RIGHT ELBOW - COMPLETE 3+ VIEW COMPARISON:  None. FINDINGS: Diffuse bone demineralization. No evidence of acute fracture or dislocation. No focal bone lesion or bone destruction. No effusions. Narrowed irregularity demonstrated at the midshaft right humerus at the upper edge of the image corresponding to the distal aspect of the shoulder arthroplasty component. Soft tissues are unremarkable. IMPRESSION: No  acute bony abnormalities. Electronically Signed   By:  Burman Nieves M.D.   On: 07/27/2016 21:50   Dg Tibia/fibula Right  Result Date: 07/25/2016 CLINICAL DATA:  Patient fell with right leg pain and abrasions EXAM: RIGHT TIBIA AND FIBULA - 2 VIEW COMPARISON:  Right knee radiographs from 03/03/2014 FINDINGS: Soft tissue masslike abnormality suspicious for hematoma measuring approximately 7.6 x 3.3 x 5.4 cm along the medial and posterior aspect of the right calf. No acute fracture nor bone destruction. 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 Chondrocalcinosis of hyaline cartilage is seen. IMPRESSION: Soft tissue masslike abnormality in the proximal calf posteromedially measuring 7.6 x 3.3 x 5.4 cm. This suspicious for hematoma. No underlying fracture is seen. There is osteoarthritis of the knee joint. Electronically Signed   By: Tollie Eth M.D.   On: 07/28/2016 21:52   Ct Head Wo Contrast  Result Date: 07/23/2016 CLINICAL DATA:  Patient presents to the ED via EMS from home. Patient reports that she fell tonight citing the fact that she "just crumped over". Presents tonight with c/o pain in her head, RIGHT elbow, RIGHT hip, and RIGHT tib/fib. Bruising noted to the right frontal area. History of subdural hematoma and craniotomy. EXAM: CT HEAD WITHOUT CONTRAST TECHNIQUE: Contiguous axial images were obtained from the base of the skull through the vertex without intravenous contrast. COMPARISON:  03/21/2016 FINDINGS: Brain: There is significant central cortical atrophy. Periventricular white matter changes are consistent with small vessel disease. There is no intra or extra-axial fluid collection or mass lesion. The basilar cisterns and ventricles have a normal appearance. There is no CT evidence for acute infarction or hemorrhage. Vascular: There is atherosclerotic calcification of the internal carotid arteries. Skull: Left frontal and left parietal burr holes. No acute fracture. Sinuses/Orbits: No acute finding. Other: Right frontal scalp  edema/hematoma.  No underlying fracture. IMPRESSION: 1. Atrophy and small vessel disease. 2.  No evidence for acute intracranial abnormality. 3. Right frontal scalp edema/hematoma without underlying fracture. Electronically Signed   By: Norva Pavlov M.D.   On: 07/11/2016 21:18   Dg Hip Unilat W Or Wo Pelvis 2-3 Views Right  Result Date: 07/16/2016 CLINICAL DATA:  Right hip pain after a fall tonight. EXAM: DG HIP (WITH OR WITHOUT PELVIS) 2-3V RIGHT COMPARISON:  CT abdomen and pelvis 11/07/2015.  Pelvis 03/03/2014 FINDINGS: Postoperative changes with bilateral total hip arthroplasties. The most distal portion of the left hip arthroplasty is not included within the field of view. Visualize components appear well seated. No component dislocation. Diffuse bone demineralization. Old healed fracture deformities of the superior and inferior pubic rami bilaterally. 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 No evidence of acute fracture or dislocation. Visualized sacrum appears intact. Postoperative changes consistent with hernia repair on the left. IMPRESSION: Bilateral total hip arthroplasties. Old pubic ramus fracture deformities bilaterally. No acute bony abnormalities. Electronically Signed   By: Burman Nieves M.D.   On: 07/30/2016 21:54     ASSESSMENT AND PLAN:   This is a 82 y.o. female with a history of afib, HTN, HLD, hypothyroidism, subdural hematoma now being admitted with  1. Right leg pain  hematoma status post mechanical fall.  Drop in Hemoglobin InD done in ED. Will need to monitor for bleeding. Acute blood loss anemia  2. AKI -gentle IV fluid hydration and recheck BMP in the morning Repeat labs in AM  3.   hyperlipidemia-continue Lipitor  4. hypertension-continue Lasix  5.  hypothyroidism-continue Synthroid  6. GERD-continue Prilosec  7. Atrial fibrillation Rate controlled. Monitor on telemetry. On Aspirin and plavix. Hold due to hematoma  All the records are  reviewed and case discussed with Care Management/Social Workerr. Management plans discussed with the patient, family and they are in agreement.  CODE STATUS: FULL CODE  DVT Prophylaxis: SCDs  TOTAL TIME TAKING CARE OF THIS PATIENT: 25 minutes.   POSSIBLE D/C IN 1-2 DAYS, DEPENDING ON CLINICAL CONDITION.  Milagros LollSudini, Idan Prime R M.D on 07/13/2016 at 11:38 AM  Between 7am to 6pm - Pager - [phone]  After 6pm go to [email or web address] - password [email or web address]  [facility] [place] [facility]  Office  [phone]  CC: Primary care physician; Marikay AlarEric Sonnenberg, MD  Note: This dictation was prepared with Dragon dictation along with smaller phrase technology. Any transcriptional errors that result from this process are unintentional.

## 2016-07-13 NOTE — [place] FL2 (Signed)
[place] MEDICAID FL2 LEVEL OF CARE SCREENING TOOL     IDENTIFICATION  Patient Name: Elizabeth Horne Birthdate: [birth year] Sex: female Admission Date (Current Location): 07/13/2016  [place]   Facility and Address:  [facility], [address], [place], [place] [address]      Provider Number: [id number]  Attending Physician Name and Address:  Milagros LollSrikar Sudini, MD  Relative Name and Phone Number:       Current Level of Care: Hospital Recommended Level of Care: Skilled Nursing Facility Prior Approval Number:    Date Approved/Denied:   PASRR Number:  ( [id number] )  Discharge Plan: SNF    Current Diagnoses: Patient Active Problem List   Diagnosis Date Noted  . Intractable pain 07/13/2016  . Plantar wart of right foot 03/26/2016  . CVA (cerebral infarction) 03/21/2016  . Bereavement 03/08/2016  . Dementia 10/19/2015  . Baker's cyst of knee 03/16/2015  . Adjustment disorder with mixed anxiety and depressed mood 07/07/2014  . HLD (hyperlipidemia) 06/08/2014  . BP (high blood pressure) 06/08/2014  . Hypothyroidism, secondary 06/08/2014  . Osteoporosis, post-menopausal 06/08/2014  . Intracranial subdural hematoma (HCC) 06/08/2014  . Basilar artery insufficiency 06/08/2014  . Allergic rhinitis 06/08/2014  . Atrial fibrillation (HCC) 06/08/2014  . Cachexia (HCC) 06/08/2014  . H/O surgical procedure 01/29/2014  . Wedge fracture of thoracic vertebra (HCC) 01/29/2014  . CN (constipation) 01/29/2014  . Bronchitis, chronic (HCC) 01/11/2014  . H/O gastrointestinal disease 01/11/2014  . Arthritis, degenerative 01/11/2014    Orientation RESPIRATION BLADDER Height & Weight     Self, Time, Place  O2 (2 Liters Oxygen ) Continent Weight: 103 lb 1.6 oz (46.8 kg) Height:  5\' 2"  (157.5 cm)  BEHAVIORAL SYMPTOMS/MOOD NEUROLOGICAL BOWEL NUTRITION STATUS   (none)  (none) Continent Diet (Diet: Heart Healthy )  AMBULATORY STATUS  COMMUNICATION OF NEEDS Skin   Extensive Assist Verbally Surgical wounds                       Personal Care Assistance Level of Assistance  Bathing, Feeding, Dressing Bathing Assistance: Limited assistance Feeding assistance: Independent Dressing Assistance: Limited assistance     Functional Limitations Info  Sight, Hearing, Speech Sight Info: Adequate Hearing Info: Impaired Speech Info: Adequate    SPECIAL CARE FACTORS FREQUENCY  PT (By licensed PT), OT (By licensed OT)     PT Frequency:  (5) OT Frequency:  (5)            Contractures      Additional Factors Info  Code Status, Allergies Code Status Info:  (Full Code. ) Allergies Info:  ( Tramadol)           Current Medications (07/13/2016):  This is the current hospital active medication list Current Facility-Administered Medications  Medication Dose Route Frequency Provider Last Rate Last Dose  . 0.9 %  sodium chloride infusion   Intravenous Continuous Alexis Hugelmeyer, DO 75 mL/hr at 07/13/16 0240    . acetaminophen (TYLENOL) tablet 650 mg  650 mg Oral Q6H PRN Alexis Hugelmeyer, DO       Or  . acetaminophen (TYLENOL) suppository 650 mg  650 mg Rectal Q6H PRN Alexis Hugelmeyer, DO      . aspirin EC tablet 81 mg  81 mg Oral Daily Alexis Hugelmeyer, DO   81 mg at 07/13/16 1027  . atorvastatin (LIPITOR) tablet 40 mg  40 mg Oral q1800 Alexis Hugelmeyer, DO      .  bisacodyl (DULCOLAX) EC tablet 5 mg  5 mg Oral Daily PRN Alexis Hugelmeyer, DO      . cholecalciferol (VITAMIN D) tablet 2,000 Units  2,000 Units Oral Daily Alexis Hugelmeyer, DO   2,000 Units at 07/13/16 1027  . furosemide (LASIX) tablet 20 mg  20 mg Oral Daily Alexis Hugelmeyer, DO   20 mg at 07/13/16 1027  . heparin injection 5,000 Units  5,000 Units Subcutaneous Q12H Alexis Hugelmeyer, DO   5,000 Units at 07/13/16 0245  . levothyroxine (SYNTHROID, LEVOTHROID) tablet 100 mcg  100 mcg Oral QAC breakfast Alexis Hugelmeyer, DO   100 mcg at 07/13/16  0758  . magnesium citrate solution 1 Bottle  1 Bottle Oral Once PRN Alexis Hugelmeyer, DO      . morphine 4 MG/ML injection 2 mg  2 mg Intravenous Once Jennye MoccasinBrian S Quigley, MD      . ondansetron [facility](ZOFRAN) tablet 4 mg  4 mg Oral Q6H PRN Alexis Hugelmeyer, DO       Or  . ondansetron (ZOFRAN) injection 4 mg  4 mg Intravenous Q6H PRN Alexis Hugelmeyer, DO      . pantoprazole (PROTONIX) EC tablet 40 mg  40 mg Oral QAC breakfast Alexis Hugelmeyer, DO   40 mg at 07/13/16 0758  . senna-docusate (Senokot-S) tablet 1 tablet  1 tablet Oral QHS PRN Alexis Hugelmeyer, DO      . sodium chloride flush (NS) 0.9 % injection 3 mL  3 mL Intravenous Q12H Alexis Hugelmeyer, DO   3 mL at 07/13/16 0240     Discharge Medications: Please see discharge summary for a list of discharge medications.  Relevant Imaging Results:  Relevant Lab Results:   Additional Information  (SSN: [id number])  Yusuke Beza, [name], LCSW

## 2016-07-14 ENCOUNTER — Inpatient Hospital Stay: Payer: Medicare Other

## 2016-07-14 DIAGNOSIS — S81811A Laceration without foreign body, right lower leg, initial encounter: Secondary | ICD-10-CM

## 2016-07-14 DIAGNOSIS — S8011XA Contusion of right lower leg, initial encounter: Secondary | ICD-10-CM

## 2016-07-14 LAB — BASIC METABOLIC PANEL
ANION GAP: 5 (ref 5–15)
BUN: 51 mg/dL — ABNORMAL HIGH (ref 6–20)
CALCIUM: 8.3 mg/dL — AB (ref 8.9–10.3)
CO2: 28 mmol/L (ref 22–32)
Chloride: 108 mmol/L (ref 101–111)
Creatinine, Ser: 2.26 mg/dL — ABNORMAL HIGH (ref 0.44–1.00)
GFR, EST AFRICAN AMERICAN: 21 mL/min — AB (ref >60)
GFR, EST NON AFRICAN AMERICAN: 18 mL/min — AB (ref >60)
Glucose, Bld: 122 mg/dL — ABNORMAL HIGH (ref 65–99)
POTASSIUM: 4.9 mmol/L (ref 3.5–5.1)
Sodium: 141 mmol/L (ref 135–145)

## 2016-07-14 LAB — HEMOGLOBIN: HEMOGLOBIN: 9.6 g/dL — AB (ref 12.0–16.0)

## 2016-07-14 MED ORDER — METOPROLOL TARTRATE 25 MG PO TABS
12.5000 mg | ORAL_TABLET | Freq: Two times a day (BID) | ORAL | Status: DC
  Administered 2016-07-14: 12.5 mg via ORAL
  Filled 2016-07-14 (×2): qty 1

## 2016-07-14 MED ORDER — SODIUM CHLORIDE 0.9 % IV SOLN: INTRAVENOUS | Status: AC

## 2016-07-14 MED ORDER — LEVALBUTEROL HCL 1.25 MG/0.5ML IN NEBU: 1.2500 mg | INHALATION_SOLUTION | Freq: Four times a day (QID) | RESPIRATORY_TRACT | Status: DC | PRN

## 2016-07-14 MED ORDER — OXYCODONE HCL 5 MG PO TABS
5.0000 mg | ORAL_TABLET | ORAL | Status: DC | PRN
  Administered 2016-07-14 (×2): 5 mg via ORAL
  Filled 2016-07-14 (×2): qty 1

## 2016-07-14 MED ORDER — MOMETASONE FURO-FORMOTEROL FUM 200-5 MCG/ACT IN AERO
2.0000 | INHALATION_SPRAY | Freq: Two times a day (BID) | RESPIRATORY_TRACT | Status: DC
  Administered 2016-07-14: 2 via RESPIRATORY_TRACT
  Filled 2016-07-14: qty 8.8

## 2016-07-14 NOTE — Progress Notes (Signed)
Pt requesting pain medicine. Pt notified that oxycodone was given at 0859, and she can have it Q6 PRN. Rn notified MD Gouru, Per MD change frequency to  Q4 PRN. Order updated.

## 2016-07-14 NOTE — Evaluation (Signed)
Physical Therapy Evaluation Patient Details Name: Elizabeth Horne Kinser MRN: [id number] DOB: 1926/04/08 Today's Date: 07/14/2016   History of Present Illness  82 yo female with onset of a fall with significant pain was admitted and had aspiration of R calf hematoma. Has care at home 10 hours a day, previously walked with distant supervision on her RW.   PMHx:  SDH, thyroid disease, HLD, a-fib, HTN, craniotomy, partial thyroidectomy, OA, old pelvic fracture, prev B THA's  Clinical Impression  Pt is in pain and her R calf is sensitive but complaining of R hip pain more now.  Has elevated pulses up to 132 with her distress over pain to attempt to move, and reported all to nursing.  Pt is appropriate given her PLOF for SNF admission and will follow her as she remains here for acute therapy to increase her mobility and work on ROM to R hip to ease her symptoms of soreness from her fall.    Follow Up Recommendations SNF    Equipment Recommendations  None recommended by PT    Recommendations for Other Services Rehab consult     Precautions / Restrictions Precautions Precautions: Fall (telemetry) Precaution Comments: poor movement tolerance Restrictions Weight Bearing Restrictions: No      Mobility  Bed Mobility Overal bed mobility: Needs Assistance Bed Mobility: Supine to Sit     Supine to sit: Total assist;HOB elevated     General bed mobility comments: pt resisted PT helping her to side of bed and returned her to bed,total assist to scoot up in bed  Transfers                 General transfer comment: pt could not stand up due to pain  Ambulation/Gait             General Gait Details: unable to tolerate  Stairs            Wheelchair Mobility    Modified Rankin (Stroke Patients Only)       Balance                                             Pertinent Vitals/Pain Pain Assessment: Faces Pain Score: 9  Faces Pain Scale: Hurts whole  lot Pain Location: R hip with any movement Pain Intervention(s): Limited activity within patient's tolerance;Monitored during session;Premedicated before session;Repositioned    Home Living Family/patient expects to be discharged to:: Private residence Living Arrangements: Alone Available Help at Discharge: Family;Personal care attendant;Available PRN/intermittently (10 hours a day of care) Type of Home: Other(Comment) (condominium) Home Access: Stairs to enter   [employer] CorporationEntrance Stairs-Number of Steps: 6 Home Layout: Two level Home Equipment: Walker - 2 wheels;Bedside commode;Shower seat - built in      Prior Function Level of Independence: Independent with assistive device(s);Needs assistance   Gait / Transfers Assistance Needed: had RW for outdoor walks with family asssist  ADL's / Homemaking Assistance Needed: help at home for housework and care        Hand Dominance        Extremity/Trunk Assessment   Upper Extremity Assessment: Generalized weakness           Lower Extremity Assessment: Generalized weakness      Cervical / Trunk Assessment: Kyphotic  Communication   Communication: HOH  Cognition Arousal/Alertness: Lethargic Behavior During Therapy: Flat affect Overall Cognitive Status: Within Functional Limits  for tasks assessed                      General Comments General comments (skin integrity, edema, etc.): Pt has a wrap on her R calf, has multiple areas of bruising    Exercises     Assessment/Plan    PT Assessment Patient needs continued PT services  PT Problem List Decreased strength;Decreased range of motion;Decreased activity tolerance;Decreased balance;Decreased mobility;Decreased coordination;Decreased safety awareness;Cardiopulmonary status limiting activity;Decreased knowledge of precautions;Decreased skin integrity;Pain          PT Treatment Interventions DME instruction;Gait training;Stair training;Functional mobility  training;Therapeutic activities;Therapeutic exercise;Balance training;Neuromuscular re-education;Patient/family education    PT Goals (Current goals can be found in the Care Plan section)  Acute Rehab PT Goals Patient Stated Goal: none stated, could not rank her pain PT Goal Formulation: With family Time For Goal Achievement: 07/28/16 Potential to Achieve Goals: Fair    Frequency Min 2X/week   Barriers to discharge Inaccessible home environment has steps to her condo    Co-evaluation               End of Session Equipment Utilized During Treatment: Oxygen Activity Tolerance: Patient limited by pain Patient left: in bed;with call bell/phone within reach;with bed alarm set;with family/visitor present Nurse Communication: Mobility status;Other (comment) (elevated pulses)         Time: [id number] PT Time Calculation (min) (ACUTE ONLY): 27 min   Charges:   PT Evaluation $PT Eval Moderate Complexity: 1 Procedure PT Treatments $Therapeutic Activity: 23-37 mins   PT G Codes:        Ivar DrapeStout, Lan Entsminger E 07/14/2016, 12:28 PM    Samul Dadauth Briah Nary, PT MS Acute Rehab Dept. Number: [facility] [phone] and [facility] [phone]

## 2016-07-14 NOTE — Progress Notes (Signed)
[facility] - [place] [facility]   PATIENT NAME: Elizabeth Horne    MR#:  [id number]  DATE OF BIRTH:  12-04-1925  SUBJECTIVE:  CHIEF COMPLAINT:  Patient is feeling weak and tired with decreased by mouth intake according to the son and daughter at bedside. No other bleeding episodes from the leg  REVIEW OF SYSTEMS:  CONSTITUTIONAL: No fever. Reporting generalized weakness and decreased by mouth intake EYES: No blurred or double vision.  EARS, NOSE, AND THROAT: No tinnitus or ear pain.  RESPIRATORY: No cough, shortness of breath, wheezing or hemoptysis.  CARDIOVASCULAR: No chest pain, orthopnea, edema.  GASTROINTESTINAL: No nausea, vomiting, diarrhea or abdominal pain.  GENITOURINARY: No dysuria, hematuria.  ENDOCRINE: No polyuria, nocturia,  HEMATOLOGY: No anemia, easy bruising or bleeding SKIN: No rash or lesion. MUSCULOSKELETAL: No joint pain or arthritis.   NEUROLOGIC: No tingling, numbness, weakness.  PSYCHIATRY: No anxiety or depression.   DRUG ALLERGIES:   Allergies  Allergen Reactions  . Tramadol Nausea Only    VITALS:  Blood pressure 114/73, pulse (!) 106, temperature 98.2 F (36.8 C), temperature source Oral, resp. rate 18, height 5\' 2"  (1.575 m), weight 46.8 kg (103 lb 1.6 oz), SpO2 98 %.  PHYSICAL EXAMINATION:  GENERAL:  82 y.o.-year-old patient lying in the bed with no acute distress.  EYES: Pupils equal, round, reactive to light and accommodation. No scleral icterus. Extraocular muscles intact.  HEENT: Head atraumatic, normocephalic. Oropharynx and nasopharynx clear.  NECK:  Supple, no jugular venous distention. No thyroid enlargement, no tenderness.  LUNGS: Normal breath sounds bilaterally, no wheezing, rales,rhonchi or crepitation. No use of accessory muscles of respiration.  CARDIOVASCULAR: S1, S2 normal. No murmurs, rubs, or gallops.  ABDOMEN: Soft, nontender, nondistended. Bowel sounds present. No organomegaly or mass.  EXTREMITIES:  Right lower leg and the pressure bandage No pedal edema, cyanosis, or clubbing.  NEUROLOGIC: Cranial nerves II through XII are intact. Muscle strength4/5 in all extremities. Sensation intact. Gait not checked.  PSYCHIATRIC: The patient is alert and oriented x 3.  SKIN: No obvious rash, lesion, or ulcer.    LABORATORY PANEL:   CBC  Recent Labs Lab 07/13/16 0325  07/14/16 0415  WBC 15.9*  --   --   HGB 10.1*  < > 9.6*  HCT 30.6*  --   --   PLT 161  --   --   < > = values in this interval not displayed. ------------------------------------------------------------------------------------------------------------------  Chemistries   Recent Labs Lab 07-19-2016 2150  07/14/16 0415  NA 139  < > 141  K 4.2  < > 4.9  CL 103  < > 108  CO2 24  < > 28  GLUCOSE 110*  < > 122*  BUN 43*  < > 51*  CREATININE 1.59*  < > 2.26*  CALCIUM 8.7*  < > 8.3*  AST 38  --   --   ALT 28  --   --   ALKPHOS 99  --   --   BILITOT 0.5  --   --   < > = values in this interval not displayed. ------------------------------------------------------------------------------------------------------------------  Cardiac Enzymes No results for input(s): TROPONINI in the last 168 hours. ------------------------------------------------------------------------------------------------------------------  RADIOLOGY:  Dg Elbow Complete Right  Result Date: 07-19-2016 CLINICAL DATA:  Larey Seat tonight at home. Pain in the right elbow, right hip, and right lower leg. Abrasions to the right lower leg. EXAM: RIGHT ELBOW - COMPLETE 3+ VIEW COMPARISON:  None. FINDINGS: Diffuse bone demineralization. No  evidence of acute fracture or dislocation. No focal bone lesion or bone destruction. No effusions. Narrowed irregularity demonstrated at the midshaft right humerus at the upper edge of the image corresponding to the distal aspect of the shoulder arthroplasty component. Soft tissues are unremarkable. IMPRESSION: No acute bony  abnormalities. Electronically Signed   By: Burman NievesWilliam  Stevens M.D.   On: 07/07/2016 21:50   Dg Tibia/fibula Right  Result Date: 07/11/2016 CLINICAL DATA:  Patient fell with right leg pain and abrasions EXAM: RIGHT TIBIA AND FIBULA - 2 VIEW COMPARISON:  Right knee radiographs from 03/03/2014 FINDINGS: Soft tissue masslike abnormality suspicious for hematoma measuring approximately 7.6 x 3.3 x 5.4 cm along the medial and posterior aspect of the right calf. No acute fracture nor bone destruction. 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 Chondrocalcinosis of hyaline cartilage is seen. IMPRESSION: Soft tissue masslike abnormality in the proximal calf posteromedially measuring 7.6 x 3.3 x 5.4 cm. This suspicious for hematoma. No underlying fracture is seen. There is osteoarthritis of the knee joint. Electronically Signed   By: Tollie Ethavid  Kwon M.D.   On: 07/10/2016 21:52   Ct Head Wo Contrast  Result Date: 07/06/2016 CLINICAL DATA:  Patient presents to the ED via EMS from home. Patient reports that she fell tonight citing the fact that she "just crumped over". Presents tonight with c/o pain in her head, RIGHT elbow, RIGHT hip, and RIGHT tib/fib. Bruising noted to the right frontal area. History of subdural hematoma and craniotomy. EXAM: CT HEAD WITHOUT CONTRAST TECHNIQUE: Contiguous axial images were obtained from the base of the skull through the vertex without intravenous contrast. COMPARISON:  03/21/2016 FINDINGS: Brain: There is significant central cortical atrophy. Periventricular white matter changes are consistent with small vessel disease. There is no intra or extra-axial fluid collection or mass lesion. The basilar cisterns and ventricles have a normal appearance. There is no CT evidence for acute infarction or hemorrhage. Vascular: There is atherosclerotic calcification of the internal carotid arteries. Skull: Left frontal and left parietal burr holes. No acute fracture. Sinuses/Orbits: No acute  finding. Other: Right frontal scalp edema/hematoma.  No underlying fracture. IMPRESSION: 1. Atrophy and small vessel disease. 2.  No evidence for acute intracranial abnormality. 3. Right frontal scalp edema/hematoma without underlying fracture. Electronically Signed   By: Norva PavlovElizabeth  Brown M.D.   On: 07/14/2016 21:18   Dg Hip Unilat W Or Wo Pelvis 2-3 Views Right  Result Date: 07/22/2016 CLINICAL DATA:  Right hip pain after a fall tonight. EXAM: DG HIP (WITH OR WITHOUT PELVIS) 2-3V RIGHT COMPARISON:  CT abdomen and pelvis 11/07/2015.  Pelvis 03/03/2014 FINDINGS: Postoperative changes with bilateral total hip arthroplasties. The most distal portion of the left hip arthroplasty is not included within the field of view. Visualize components appear well seated. No component dislocation. Diffuse bone demineralization. Old healed fracture deformities of the superior and inferior pubic rami bilaterally. 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 No evidence of acute fracture or dislocation. Visualized sacrum appears intact. Postoperative changes consistent with hernia repair on the left. IMPRESSION: Bilateral total hip arthroplasties. Old pubic ramus fracture deformities bilaterally. No acute bony abnormalities. Electronically Signed   By: Burman NievesWilliam  Stevens M.D.   On: 08/01/2016 21:54    EKG:   Orders placed or performed during the hospital encounter of 07/11/2016  . EKG 12-Lead  . EKG 12-Lead  . EKG 12-Lead  . EKG 12-Lead    ASSESSMENT AND PLAN:   This is a [age] y.o.femalewith a history of  afib, HTN, HLD, hypothyroidism, subdural hematomanow being admitted with  1.  AKI - Prerenal with poor by mouth intake  Creatinine 1.1-1.59 1.90-2.26 Hold Lasix Nephrology consult repeat labs in a.m. Hydrate with IV fluids Increase by mouth fluid intake    2. Right leg pain  hematoma status post mechanical fall.  Drop in Hemoglobin initially-11.6-10.1-9.5 and 9.6, stable at this time start iron  tablets with Colace InD done in ED.Evaluated by surgery Dr. Orvis BrillLoflin who has no other recommendations She has recommend a kerlex and ace wrap to remain on these areas changed once a day to once every other day.  The steristrips placed on the lanced area will fall off in a couple weeks Acute blood loss anemia. No need for transfusion    3.  hyperlipidemia-continue Lipitor  4. hypertension-continue Lasix  5.  hypothyroidism-continue Synthroid  6. GERD-continue Prilosec  7. Atrial fibrillation-chronic  Patient is started on small dose of metoprolol 12.5 mg by mouth twice a day for rate control Monitor on telemetry. Aspirin and Plavix held. Can be restarted in 3 days after discharge if no further bleeding noticed.      All the records are reviewed and case discussed with Care Management/Social Workerr. Management plans discussed with the patient, son and daughter and they are in agreement.  CODE STATUS: fc, son and daughter   TOTAL TIME TAKING CARE OF THIS PATIENT: 36minutes.   POSSIBLE D/C IN 1-2 DAYS, DEPENDING ON CLINICAL CONDITION.  Note: This dictation was prepared with Dragon dictation along with smaller phrase technology. Any transcriptional errors that result from this process are unintentional.   Ramonita LabGouru, Lynea Rollison M.D on 07/14/2016 at 1:12 PM  Between 7am to 6pm - Pager - [phone] After 6pm go to [email or web address] - password [email or web address] [facility]  [place]  [facility]  Office  [phone]  CC: Primary care physician; Marikay AlarEric Sonnenberg, MD

## 2016-07-14 NOTE — Progress Notes (Signed)
Pt has a history of A Fib, HR in the upper 120's. No medication scheduled for A Fib. MD Gouru notified. MD placing orders.

## 2016-07-14 NOTE — Clinical Social Work Note (Addendum)
[facility] pending PT clearance. The patient's son and daughter-in-law were at bedside and are aware of the dc plan. The facility is aware. CSW will con't to follow pending any additional dc needs.  UPDATE: Dr. Amado CoeGouru cancelled dc due to kidney issues. Will con't to follow.  Elizabeth PonderKaren Martha Talin Horne, MSW, LCSW-A [phone]

## 2016-07-14 NOTE — Consult Note (Signed)
[facility] CONSULT NOTE    Date: 07/14/2016                  Patient Name:  Elizabeth Horne  MRN: [id number]  DOB: 1926/06/09  Age / Sex: 82 y.o., female         PCP: Tommi Rumps, MD                 Service Requesting Consult: Dr. Margaretmary Eddy                 Reason for Consult: Acute renal failure/CKD stage III            History of Present Illness: Patient is a 82 y.o. female with a PMHx of Hypertension, hyperlipidemia, hypothyroidism, history of subdural hematoma, atrial fibrillation who was admitted to [facility] on 07/27/2016 for evaluation of fall. She was admitted subsequently as she was found to have right lower extremity hematoma which was evacuated by emergency department physician. She has significant bruising on her right side.  We are asked to see her for evaluation management of acute renal failure. The patient has underlying chronic kidney disease stage III with a baseline EGFR 42. Unclear as to whether she's had good by mouth intake over the course of this hospitalization. She is also noted to be on furosemide. Case discussed with hospitalist and she has been started on IV fluid hydration. Patient is extremely hard of hearing and is a poor historian at the moment.   Medications: Outpatient medications: Facility-Administered Medications Prior to Admission  Medication Dose Route Frequency Provider Last Rate Last Dose  . cyanocobalamin ((VITAMIN B-12)) injection 1,000 mcg  1,000 mcg Intramuscular Once Leone Haven, MD       Prescriptions Prior to Admission  Medication Sig Dispense Refill Last Dose  . aspirin 81 MG tablet Take 81 mg by mouth daily.   07/19/2016 at Unknown time  . atorvastatin (LIPITOR) 40 MG tablet TAKE ONE TABLET BY MOUTH EVERY DAY AT 6 IN THE EVENING 90 tablet 3 07/09/2016 at Unknown time  . Cholecalciferol (VITAMIN D3) 2000 units capsule Take 1 capsule (2,000 Units total) by mouth daily. 30 capsule 0 08/02/2016 at Unknown time  .  clopidogrel (PLAVIX) 75 MG tablet TAKE ONE TABLET BY MOUTH EVERY DAY 90 tablet 3 07/21/2016 at Unknown time  . cyanocobalamin (,VITAMIN B-12,) 1000 MCG/ML injection INJECT 1 ML IM EACH MONTH 30 mL 11 Past Month at Unknown time  . furosemide (LASIX) 20 MG tablet Take by mouth.   07/26/2016 at Unknown time  . levothyroxine (SYNTHROID, LEVOTHROID) 75 MCG tablet Take 1.5 tablets (112.5 mcg total) by mouth daily before breakfast. 90 tablet 2 07/22/2016 at Unknown time  . omeprazole (PRILOSEC) 20 MG capsule TAKE 1 CAPSULE BY MOUTH EVERY DAY 90 capsule 2 07/20/2016 at Unknown time    Current medications: Current Facility-Administered Medications  Medication Dose Route Frequency Provider Last Rate Last Dose  . 0.9 %  sodium chloride infusion   Intravenous Continuous Alexis Hugelmeyer, DO 75 mL/hr at 07/14/16 1101    . 0.9 %  sodium chloride infusion   Intravenous Continuous Nicholes Mango, MD      . acetaminophen (TYLENOL) tablet 650 mg  650 mg Oral Q6H PRN Alexis Hugelmeyer, DO       Or  . acetaminophen (TYLENOL) suppository 650 mg  650 mg Rectal Q6H PRN Alexis Hugelmeyer, DO      . atorvastatin (LIPITOR) tablet 40 mg  40 mg Oral q1800  Alexis Hugelmeyer, DO   40 mg at 07/13/16 1809  . bisacodyl (DULCOLAX) EC tablet 5 mg  5 mg Oral Daily PRN Alexis Hugelmeyer, DO      . cholecalciferol (VITAMIN D) tablet 2,000 Units  2,000 Units Oral Daily Alexis Hugelmeyer, DO   2,000 Units at 07/14/16 0859  . levothyroxine (SYNTHROID, LEVOTHROID) tablet 100 mcg  100 mcg Oral QAC breakfast Alexis Hugelmeyer, DO   100 mcg at 07/13/16 0758  . magnesium citrate solution 1 Bottle  1 Bottle Oral Once PRN Alexis Hugelmeyer, DO      . metoprolol tartrate (LOPRESSOR) tablet 12.5 mg  12.5 mg Oral BID Nicholes Mango, MD   12.5 mg at 07/14/16 1056  . morphine 4 MG/ML injection 2 mg  2 mg Intravenous Once Daymon Larsen, MD      . ondansetron [facility] tablet 4 mg  4 mg Oral Q6H PRN Alexis Hugelmeyer, DO       Or  . ondansetron (ZOFRAN)  injection 4 mg  4 mg Intravenous Q6H PRN Alexis Hugelmeyer, DO      . oxyCODONE (Oxy IR/ROXICODONE) immediate release tablet 5 mg  5 mg Oral Q6H PRN Hillary Bow, MD   5 mg at 07/14/16 0859  . pantoprazole (PROTONIX) EC tablet 40 mg  40 mg Oral QAC breakfast Alexis Hugelmeyer, DO   40 mg at 07/14/16 0900  . senna-docusate (Senokot-S) tablet 1 tablet  1 tablet Oral QHS PRN Alexis Hugelmeyer, DO      . sodium chloride flush (NS) 0.9 % injection 3 mL  3 mL Intravenous Q12H Alexis Hugelmeyer, DO   3 mL at 07/13/16 0240      Allergies: Allergies  Allergen Reactions  . Tramadol Nausea Only      Past Medical History: Past Medical History:  Diagnosis Date  . A-fib ([place])   . Arthritis   . Hyperlipidemia   . Hypertension   . Subdural hematoma ([place])   . Thyroid disease      Past Surgical History: Past Surgical History:  Procedure Laterality Date  . ABDOMINAL HYSTERECTOMY     menorrhagia  . CRANIOTOMY    . JOINT REPLACEMENT    . KYPHOPLASTY  2015   x 2, Dr. Rudene Christians  . THYROIDECTOMY, PARTIAL     benign  . TONSILLECTOMY AND ADENOIDECTOMY    . TOTAL HIP ARTHROPLASTY Bilateral   . TOTAL SHOULDER ARTHROPLASTY Bilateral   . VAGINAL DELIVERY     2     Family History: Family History  Problem Relation Age of Onset  . Lung disease Sister      Social History: Social History   Social History  . Marital status: Widowed    Spouse name: N/A  . Number of children: N/A  . Years of education: N/A   Occupational History  . Not on file.   Social History Main Topics  . Smoking status: Former Smoker    Types: Cigarettes    Quit date: 06/09/1987  . Smokeless tobacco: Never Used  . Alcohol use 0.6 oz/week    1 Glasses of wine per week     Comment: daily glass of wine or 2  . Drug use: No  . Sexual activity: No   Other Topics Concern  . Not on file   Social History Narrative   Lives in [place]. Has 10hr assistance during the day.      Two children.           Review  of Systems: Cannot  offer accurate ROS due to inability to hear and concentrate on questions  Vital Signs: Blood pressure 114/73, pulse (!) 106, temperature 98.2 F (36.8 C), temperature source Oral, resp. rate 18, height 5' 2"  (1.575 m), weight 46.8 kg (103 lb 1.6 oz), SpO2 98 %.  Weight trends: Filed Weights   07/31/2016 2043 07/13/16 0240 07/13/16 0800  Weight: 40.8 kg (90 lb) 44 kg (97 lb) 46.8 kg (103 lb 1.6 oz)    Physical Exam: General: NAD, laying in bed  Head: Normocephalic, atraumatic.  Eyes: Anicteric, EOMI  Nose: Mucous membranes dryt, not inflammed, nonerythematous.  Throat: Oropharynx nonerythematous, tongue dry  Neck: Supple, trachea midline.  Lungs:  Normal respiratory effort. Mild bilateral wheezing  Heart: S1S2 no rubs  Abdomen:  BS normoactive. Soft, Nondistended, non-tender.  No masses or organomegaly.  Extremities: Right lower extremity wrapped  Neurologic: Awake, alert, follows simple commands, hard of hearing  Skin: Ecchymoses on right upper and lower extremeties    Lab results: Basic Metabolic Panel:  Recent Labs Lab 07/17/2016 2150 07/13/16 0325 07/14/16 0415  NA 139 140 141  K 4.2 4.5 4.9  CL 103 104 108  CO2 24 28 28   GLUCOSE 110* 114* 122*  BUN 43* 47* 51*  CREATININE 1.59* 1.90* 2.26*  CALCIUM 8.7* 8.4* 8.3*    Liver Function Tests:  Recent Labs Lab 07/10/2016 2150  AST 38  ALT 28  ALKPHOS 99  BILITOT 0.5  PROT 5.9*  ALBUMIN 3.5   No results for input(s): LIPASE, AMYLASE in the last 168 hours. No results for input(s): AMMONIA in the last 168 hours.  CBC:  Recent Labs Lab 07/18/2016 2213 07/13/16 0325 07/13/16 1206 07/14/16 0415  WBC 17.9* 15.9*  --   --   HGB 11.6* 10.1* 9.5* 9.6*  HCT 35.4 30.6*  --   --   MCV 95.9 96.0  --   --   PLT 189 161  --   --     Cardiac Enzymes:  Recent Labs Lab 07/13/16 0325  CKTOTAL 77    BNP: Invalid input(s): POCBNP  CBG: No results for input(s): GLUCAP in the last 168  hours.  Microbiology: Results for orders placed or performed in visit on 12/18/11  Urine culture     Status: None   Collection Time: 12/18/11  9:10 PM  Result Value Ref Range Status   Micro Text Report   Final       SOURCE: CLEAN CATCH    COMMENT                   NO GROWTH IN 36 HOURS   ANTIBIOTIC                                                        Coagulation Studies: No results for input(s): LABPROT, INR in the last 72 hours.  Urinalysis: No results for input(s): COLORURINE, LABSPEC, PHURINE, GLUCOSEU, HGBUR, BILIRUBINUR, KETONESUR, PROTEINUR, UROBILINOGEN, NITRITE, LEUKOCYTESUR in the last 72 hours.  Invalid input(s): APPERANCEUR    Imaging: Dg Elbow Complete Right  Result Date: 07/25/2016 CLINICAL DATA:  [place] tonight at home. Pain in the right elbow, right hip, and right lower leg. Abrasions to the right lower leg. EXAM: RIGHT ELBOW - COMPLETE 3+ VIEW COMPARISON:  None. FINDINGS: Diffuse bone demineralization. No evidence of acute  fracture or dislocation. No focal bone lesion or bone destruction. No effusions. Narrowed irregularity demonstrated at the midshaft right humerus at the upper edge of the image corresponding to the distal aspect of the shoulder arthroplasty component. Soft tissues are unremarkable. IMPRESSION: No acute bony abnormalities. Electronically Signed   By: Lucienne Capers M.D.   On: 07/11/2016 21:50   Dg Tibia/fibula Right  Result Date: 07/29/2016 CLINICAL DATA:  Patient fell with right leg pain and abrasions EXAM: RIGHT TIBIA AND FIBULA - 2 VIEW COMPARISON:  Right knee radiographs from 03/03/2014 FINDINGS: Soft tissue masslike abnormality suspicious for hematoma measuring approximately 7.6 x 3.3 x 5.4 cm along the medial and posterior aspect of the right calf. No acute fracture nor bone destruction. 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 Chondrocalcinosis of hyaline cartilage is seen. IMPRESSION: Soft tissue masslike abnormality in the  proximal calf posteromedially measuring 7.6 x 3.3 x 5.4 cm. This suspicious for hematoma. No underlying fracture is seen. There is osteoarthritis of the knee joint. Electronically Signed   By: Ashley Royalty M.D.   On: 07/18/2016 21:52   Ct Head Wo Contrast  Result Date: 07/27/2016 CLINICAL DATA:  Patient presents to the ED via EMS from home. Patient reports that she fell tonight citing the fact that she "just crumped over". Presents tonight with c/o pain in her head, RIGHT elbow, RIGHT hip, and RIGHT tib/fib. Bruising noted to the right frontal area. History of subdural hematoma and craniotomy. EXAM: CT HEAD WITHOUT CONTRAST TECHNIQUE: Contiguous axial images were obtained from the base of the skull through the vertex without intravenous contrast. COMPARISON:  03/21/2016 FINDINGS: Brain: There is significant central cortical atrophy. Periventricular white matter changes are consistent with small vessel disease. There is no intra or extra-axial fluid collection or mass lesion. The basilar cisterns and ventricles have a normal appearance. There is no CT evidence for acute infarction or hemorrhage. Vascular: There is atherosclerotic calcification of the internal carotid arteries. Skull: Left frontal and left parietal burr holes. No acute fracture. Sinuses/Orbits: No acute finding. Other: Right frontal scalp edema/hematoma.  No underlying fracture. IMPRESSION: 1. Atrophy and small vessel disease. 2.  No evidence for acute intracranial abnormality. 3. Right frontal scalp edema/hematoma without underlying fracture. Electronically Signed   By: Nolon Nations M.D.   On: 07/07/2016 21:18   Dg Hip Unilat W Or Wo Pelvis 2-3 Views Right  Result Date: 07/06/2016 CLINICAL DATA:  Right hip pain after a fall tonight. EXAM: DG HIP (WITH OR WITHOUT PELVIS) 2-3V RIGHT COMPARISON:  CT abdomen and pelvis 11/07/2015.  Pelvis 03/03/2014 FINDINGS: Postoperative changes with bilateral total hip arthroplasties. The most distal  portion of the left hip arthroplasty is not included within the field of view. Visualize components appear well seated. No component dislocation. Diffuse bone demineralization. Old healed fracture deformities of the superior and inferior pubic rami bilaterally. 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 No evidence of acute fracture or dislocation. Visualized sacrum appears intact. Postoperative changes consistent with hernia repair on the left. IMPRESSION: Bilateral total hip arthroplasties. Old pubic ramus fracture deformities bilaterally. No acute bony abnormalities. Electronically Signed   By: Lucienne Capers M.D.   On: 07/23/2016 21:54      Assessment & Plan: Pt is a 82 y.o. female with a PMHx of Hypertension, hyperlipidemia, hypothyroidism, history of subdural hematoma, atrial fibrillation who was admitted to [facility] on 07/30/2016 for evaluation of fall. She was admitted subsequently as she was found to have right lower extremity  hematoma which was evacuated by emergency department physician  1.  Acute renal failure, suspect due to inadequate PO intake and lasix use. 2.  CKD stage III baseline egfr 42. 3.  Anemia of CKD.  4.  Hypertension:  Plan:  We are asked to see the patient for evaluation management of acute renal failure in the setting of known chronic kidney disease stage III. We will proceed with additional workup including SPEP, UPEP, and renal ultrasound. I suspect that her acute renal failure is secondary to inadequate by mouth intake in the setting of Lasix usage. Agree with discontinuation of Lasix. I will decrease IV fluid rate to 50 cc per hour. Continue to monitor hemoglobin. No indication for Procrit at the moment but this may need to be considered as an outpatient. Continue treatment of hypertension as currently being done with metoprolol. Continue to monitor the patient's renal function over the course of the hospitalization.

## 2016-07-14 NOTE — H&P (Addendum)
Elizabeth Horne is an 82 y.o. female.   Chief Complaint: fall with R leg hematoma, lanced in ED HPI: 82 yr old female with multiple medical issues on plavix had a fall at home with hematoma of RLE.  While in the ED, Dr. Jacqualine Horne lanced the area to about 7cm in the medial calf. Patient states she did not bleed much at home.  Patient denies any dizziness but is having leg pain.    Past Medical History:  Diagnosis Date  . A-fib ([place])   . Arthritis   . Hyperlipidemia   . Hypertension   . Subdural hematoma ([place])   . Thyroid disease     Past Surgical History:  Procedure Laterality Date  . ABDOMINAL HYSTERECTOMY     menorrhagia  . CRANIOTOMY    . JOINT REPLACEMENT    . KYPHOPLASTY  2015   x 2, Dr. Rudene Horne  . THYROIDECTOMY, PARTIAL     benign  . TONSILLECTOMY AND ADENOIDECTOMY    . TOTAL HIP ARTHROPLASTY Bilateral   . TOTAL SHOULDER ARTHROPLASTY Bilateral   . VAGINAL DELIVERY     2    Family History  Problem Relation Age of Onset  . Lung disease Sister    Social History:  reports that she quit smoking about 29 years ago. Her smoking use included Cigarettes. She has never used smokeless tobacco. She reports that she drinks about 0.6 oz of alcohol per week . She reports that she does not use drugs.  Allergies:  Allergies  Allergen Reactions  . Tramadol Nausea Only    Facility-Administered Medications Prior to Admission  Medication Dose Route Frequency Provider Last Rate Last Dose  . cyanocobalamin ((VITAMIN B-12)) injection 1,000 mcg  1,000 mcg Intramuscular Once Elizabeth Haven, MD       Medications Prior to Admission  Medication Sig Dispense Refill  . aspirin 81 MG tablet Take 81 mg by mouth daily.    Marland Kitchen atorvastatin (LIPITOR) 40 MG tablet TAKE ONE TABLET BY MOUTH EVERY DAY AT 6 IN THE EVENING 90 tablet 3  . Cholecalciferol (VITAMIN D3) 2000 units capsule Take 1 capsule (2,000 Units total) by mouth daily. 30 capsule 0  . clopidogrel (PLAVIX) 75 MG tablet TAKE ONE TABLET BY  MOUTH EVERY DAY 90 tablet 3  . cyanocobalamin (,VITAMIN B-12,) 1000 MCG/ML injection INJECT 1 ML IM EACH MONTH 30 mL 11  . furosemide (LASIX) 20 MG tablet Take by mouth.    . levothyroxine (SYNTHROID, LEVOTHROID) 75 MCG tablet Take 1.5 tablets (112.5 mcg total) by mouth daily before breakfast. 90 tablet 2  . omeprazole (PRILOSEC) 20 MG capsule TAKE 1 CAPSULE BY MOUTH EVERY DAY 90 capsule 2    Results for orders placed or performed during the hospital encounter of 07/11/2016 (from the past 48 hour(s))  Comprehensive metabolic panel     Status: Abnormal   Collection Time: 07/28/2016  9:50 PM  Result Value Ref Range   Sodium 139 135 - 145 mmol/L   Potassium 4.2 3.5 - 5.1 mmol/L   Chloride 103 101 - 111 mmol/L   CO2 24 22 - 32 mmol/L   Glucose, Bld 110 (H) 65 - 99 mg/dL   BUN 43 (H) 6 - 20 mg/dL   Creatinine, Ser 1.59 (H) 0.44 - 1.00 mg/dL   Calcium 8.7 (L) 8.9 - 10.3 mg/dL   Total Protein 5.9 (L) 6.5 - 8.1 g/dL   Albumin 3.5 3.5 - 5.0 g/dL   AST 38 15 - 41 U/L  ALT 28 14 - 54 U/L   Alkaline Phosphatase 99 38 - 126 U/L   Total Bilirubin 0.5 0.3 - 1.2 mg/dL   GFR calc non Af Amer 28 (L) >60 mL/min   GFR calc Af Amer 32 (L) >60 mL/min    Comment: (NOTE) The eGFR has been calculated using the CKD EPI equation. This calculation has not been validated in all clinical situations. eGFR's persistently <60 mL/min signify possible Chronic Kidney Disease.    Anion gap 12 5 - 15  CBC     Status: Abnormal   Collection Time: 08/01/2016 10:13 PM  Result Value Ref Range   WBC 17.9 (H) 3.6 - 11.0 K/uL   RBC 3.69 (L) 3.80 - 5.20 MIL/uL   Hemoglobin 11.6 (L) 12.0 - 16.0 g/dL   HCT 35.4 35.0 - 47.0 %   MCV 95.9 80.0 - 100.0 fL   MCH 31.5 26.0 - 34.0 pg   MCHC 32.8 32.0 - 36.0 g/dL   RDW 14.2 11.5 - 14.5 %   Platelets 189 150 - 440 K/uL  Basic metabolic panel     Status: Abnormal   Collection Time: 07/13/16  3:25 AM  Result Value Ref Range   Sodium 140 135 - 145 mmol/L   Potassium 4.5 3.5 - 5.1  mmol/L   Chloride 104 101 - 111 mmol/L   CO2 28 22 - 32 mmol/L   Glucose, Bld 114 (H) 65 - 99 mg/dL   BUN 47 (H) 6 - 20 mg/dL   Creatinine, Ser 1.90 (H) 0.44 - 1.00 mg/dL   Calcium 8.4 (L) 8.9 - 10.3 mg/dL   GFR calc non Af Amer 22 (L) >60 mL/min   GFR calc Af Amer 26 (L) >60 mL/min    Comment: (NOTE) The eGFR has been calculated using the CKD EPI equation. This calculation has not been validated in all clinical situations. eGFR's persistently <60 mL/min signify possible Chronic Kidney Disease.    Anion gap 8 5 - 15  CBC     Status: Abnormal   Collection Time: 07/13/16  3:25 AM  Result Value Ref Range   WBC 15.9 (H) 3.6 - 11.0 K/uL   RBC 3.18 (L) 3.80 - 5.20 MIL/uL   Hemoglobin 10.1 (L) 12.0 - 16.0 g/dL   HCT 30.6 (L) 35.0 - 47.0 %   MCV 96.0 80.0 - 100.0 fL   MCH 31.7 26.0 - 34.0 pg   MCHC 33.0 32.0 - 36.0 g/dL   RDW 13.7 11.5 - 14.5 %   Platelets 161 150 - 440 K/uL  CK     Status: None   Collection Time: 07/13/16  3:25 AM  Result Value Ref Range   Total CK 77 38 - 234 U/L  Hemoglobin     Status: Abnormal   Collection Time: 07/13/16 12:06 PM  Result Value Ref Range   Hemoglobin 9.5 (L) 12.0 - 16.0 g/dL  Hemoglobin     Status: Abnormal   Collection Time: 07/14/16  4:15 AM  Result Value Ref Range   Hemoglobin 9.6 (L) 12.0 - 16.0 g/dL  Basic metabolic panel     Status: Abnormal   Collection Time: 07/14/16  4:15 AM  Result Value Ref Range   Sodium 141 135 - 145 mmol/L   Potassium 4.9 3.5 - 5.1 mmol/L   Chloride 108 101 - 111 mmol/L   CO2 28 22 - 32 mmol/L   Glucose, Bld 122 (H) 65 - 99 mg/dL   BUN 51 (H) 6 - 20  mg/dL   Creatinine, Ser 2.26 (H) 0.44 - 1.00 mg/dL   Calcium 8.3 (L) 8.9 - 10.3 mg/dL   GFR calc non Af Amer 18 (L) >60 mL/min   GFR calc Af Amer 21 (L) >60 mL/min    Comment: (NOTE) The eGFR has been calculated using the CKD EPI equation. This calculation has not been validated in all clinical situations. eGFR's persistently <60 mL/min signify possible  Chronic Kidney Disease.    Anion gap 5 5 - 15   Dg Elbow Complete Right  Result Date: 07/27/2016 CLINICAL DATA:  [place] tonight at home. Pain in the right elbow, right hip, and right lower leg. Abrasions to the right lower leg. EXAM: RIGHT ELBOW - COMPLETE 3+ VIEW COMPARISON:  None. FINDINGS: Diffuse bone demineralization. No evidence of acute fracture or dislocation. No focal bone lesion or bone destruction. No effusions. Narrowed irregularity demonstrated at the midshaft right humerus at the upper edge of the image corresponding to the distal aspect of the shoulder arthroplasty component. Soft tissues are unremarkable. IMPRESSION: No acute bony abnormalities. Electronically Signed   By: Lucienne Capers M.D.   On: 07/09/2016 21:50   Dg Tibia/fibula Right  Result Date: 07/21/2016 CLINICAL DATA:  Patient fell with right leg pain and abrasions EXAM: RIGHT TIBIA AND FIBULA - 2 VIEW COMPARISON:  Right knee radiographs from 03/03/2014 FINDINGS: Soft tissue masslike abnormality suspicious for hematoma measuring approximately 7.6 x 3.3 x 5.4 cm along the medial and posterior aspect of the right calf. No acute fracture nor bone destruction. 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 Chondrocalcinosis of hyaline cartilage is seen. IMPRESSION: Soft tissue masslike abnormality in the proximal calf posteromedially measuring 7.6 x 3.3 x 5.4 cm. This suspicious for hematoma. No underlying fracture is seen. There is osteoarthritis of the knee joint. Electronically Signed   By: Ashley Royalty M.D.   On: 07/04/2016 21:52   Ct Head Wo Contrast  Result Date: 07/11/2016 CLINICAL DATA:  Patient presents to the ED via EMS from home. Patient reports that she fell tonight citing the fact that she "just crumped over". Presents tonight with c/o pain in her head, RIGHT elbow, RIGHT hip, and RIGHT tib/fib. Bruising noted to the right frontal area. History of subdural hematoma and craniotomy. EXAM: CT HEAD WITHOUT  CONTRAST TECHNIQUE: Contiguous axial images were obtained from the base of the skull through the vertex without intravenous contrast. COMPARISON:  03/21/2016 FINDINGS: Brain: There is significant central cortical atrophy. Periventricular white matter changes are consistent with small vessel disease. There is no intra or extra-axial fluid collection or mass lesion. The basilar cisterns and ventricles have a normal appearance. There is no CT evidence for acute infarction or hemorrhage. Vascular: There is atherosclerotic calcification of the internal carotid arteries. Skull: Left frontal and left parietal burr holes. No acute fracture. Sinuses/Orbits: No acute finding. Other: Right frontal scalp edema/hematoma.  No underlying fracture. IMPRESSION: 1. Atrophy and small vessel disease. 2.  No evidence for acute intracranial abnormality. 3. Right frontal scalp edema/hematoma without underlying fracture. Electronically Signed   By: Nolon Nations M.D.   On: 07/22/2016 21:18   Dg Hip Unilat W Or Wo Pelvis 2-3 Views Right  Result Date: 07/22/2016 CLINICAL DATA:  Right hip pain after a fall tonight. EXAM: DG HIP (WITH OR WITHOUT PELVIS) 2-3V RIGHT COMPARISON:  CT abdomen and pelvis 11/07/2015.  Pelvis 03/03/2014 FINDINGS: Postoperative changes with bilateral total hip arthroplasties. The most distal portion of the left hip arthroplasty is not included within  the field of view. Visualize components appear well seated. No component dislocation. Diffuse bone demineralization. Old healed fracture deformities of the superior and inferior pubic rami bilaterally. 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 No evidence of acute fracture or dislocation. Visualized sacrum appears intact. Postoperative changes consistent with hernia repair on the left. IMPRESSION: Bilateral total hip arthroplasties. Old pubic ramus fracture deformities bilaterally. No acute bony abnormalities. Electronically Signed   By: Lucienne Capers  M.D.   On: 07/14/2016 21:54    Review of Systems  Constitutional: Positive for malaise/fatigue and weight loss. Negative for chills, diaphoresis and fever.  HENT: Positive for hearing loss. Negative for congestion, ear pain and sinus pain.   Respiratory: Negative for cough, sputum production and shortness of breath.   Cardiovascular: Positive for leg swelling. Negative for chest pain and palpitations.  Gastrointestinal: Negative for abdominal pain, nausea and vomiting.  Genitourinary: Negative for dysuria and hematuria.  Musculoskeletal: Positive for falls, joint pain and myalgias.  Skin: Negative for rash.  Neurological: Positive for dizziness and weakness. Negative for focal weakness, loss of consciousness and headaches.  Psychiatric/Behavioral: Negative for depression. The patient is not nervous/anxious.   All other systems reviewed and are negative.   Blood pressure 113/64, pulse (!) 117, temperature 98.2 F (36.8 C), temperature source Oral, resp. rate 18, height 5' 2"  (1.575 m), weight 103 lb 1.6 oz (46.8 kg), SpO2 99 %. Physical Exam  Vitals reviewed. Constitutional: She is oriented to person, place, and time. She appears well-developed. No distress.  Frail, elderly lady with temporal wasting  HENT:  Head: Normocephalic.  Right Ear: External ear normal.  Left Ear: External ear normal.  Nose: Nose normal.  Mouth/Throat: Oropharynx is clear and moist. No oropharyngeal exudate.  Eyes: Conjunctivae and EOM are normal. Pupils are equal, round, and reactive to light. No scleral icterus.  Neck: Normal range of motion. Neck supple. No tracheal deviation present.  Cardiovascular: Normal heart sounds and intact distal pulses.  Exam reveals no gallop and no friction rub.   No murmur heard. Respiratory: Effort normal and breath sounds normal. No respiratory distress. She has no wheezes.  GI: Soft. Bowel sounds are normal. She exhibits no distension. There is no tenderness.   Musculoskeletal: She exhibits edema and tenderness. She exhibits no deformity.  Limited range of motion in shoulder and LE due to hematoma and arthritis  Neurological: She is alert and oriented to person, place, and time. No cranial nerve deficit.  Skin: Skin is warm and dry. No erythema.  Multiple areas of ecchymosis on shoulder and on RLE.  2cm skin tear near patella and mid-medial calf with 7cm laceration just inferior to large hematoma.  No more hematoma would be evacuated, but slow venous oozing was coming from area.  Steri strips were placed to reappoximate skin in both areas and dry gauze and ace bandage placed to keep dressing in place and provide some compression  Psychiatric: She has a normal mood and affect. Her behavior is normal. Judgment and thought content normal.     Assessment/Plan 82 yr old female on plavix s/p fall with RLE hematoma lanced in the ED. I have personally reviewed her past medical history which is extensive and she is at high risk for falls in the future.  I have personally reviewed her labs with decreasing Hb.  I have personally reviewed her xrays and CT head which do not show any fractures or acute injuries.  I placed steristrips on the skin to  pull the lanced area and small skin tear together.  I would recommend a kerlex and ace wrap to remain on these areas changed once a day to once every other day.  The steristrips will fall off in a couple weeks.  I do not recommend the lancing of hematomas as they can lead to infection and should she get any redness or increased pain, will need treatment with antibiotics.  She does not need any surgical intervention.     Hubbard Robinson, MD 07/14/2016, 7:09 AM

## 2016-07-15 LAB — BASIC METABOLIC PANEL
Anion gap: 8 (ref 5–15)
BUN: 62 mg/dL — AB (ref 6–20)
CHLORIDE: 109 mmol/L (ref 101–111)
CO2: 24 mmol/L (ref 22–32)
CREATININE: 2.83 mg/dL — AB (ref 0.44–1.00)
Calcium: 8.4 mg/dL — ABNORMAL LOW (ref 8.9–10.3)
GFR calc Af Amer: 16 mL/min — ABNORMAL LOW (ref >60)
GFR calc non Af Amer: 14 mL/min — ABNORMAL LOW (ref >60)
Glucose, Bld: 108 mg/dL — ABNORMAL HIGH (ref 65–99)
Potassium: 5.5 mmol/L — ABNORMAL HIGH (ref 3.5–5.1)
Sodium: 141 mmol/L (ref 135–145)

## 2016-07-15 LAB — CBC
HCT: 31.1 % — ABNORMAL LOW (ref 35.0–47.0)
Hemoglobin: 9.9 g/dL — ABNORMAL LOW (ref 12.0–16.0)
MCH: 32.5 pg (ref 26.0–34.0)
MCHC: 31.9 g/dL — ABNORMAL LOW (ref 32.0–36.0)
MCV: 101.6 fL — AB (ref 80.0–100.0)
PLATELETS: 148 10*3/uL — AB (ref 150–440)
RBC: 3.06 MIL/uL — ABNORMAL LOW (ref 3.80–5.20)
RDW: 14.9 % — AB (ref 11.5–14.5)
WBC: 10.7 10*3/uL (ref 3.6–11.0)

## 2016-07-15 LAB — POTASSIUM: Potassium: 5.6 mmol/L — ABNORMAL HIGH (ref 3.5–5.1)

## 2016-07-15 MED ORDER — MORPHINE SULFATE (PF) 2 MG/ML IV SOLN
2.0000 mg | INTRAVENOUS | Status: DC | PRN
  Administered 2016-07-15 (×2): 2 mg via INTRAVENOUS
  Filled 2016-07-15 (×2): qty 1

## 2016-07-15 MED ORDER — SODIUM POLYSTYRENE SULFONATE 15 GM/60ML PO SUSP
30.0000 g | Freq: Once | ORAL | Status: DC
  Filled 2016-07-15: qty 120

## 2016-07-15 MED ORDER — SODIUM POLYSTYRENE SULFONATE 15 GM/60ML PO SUSP
30.0000 g | Freq: Once | ORAL | Status: AC
  Administered 2016-07-15: 30 g via RECTAL

## 2016-07-15 MED ORDER — MORPHINE SULFATE (PF) 2 MG/ML IV SOLN: 2.0000 mg | INTRAVENOUS | Status: DC | PRN

## 2016-07-16 LAB — PROTEIN ELECTROPHORESIS, SERUM
A/G Ratio: 1.4 (ref 0.7–1.7)
Albumin ELP: 3.3 g/dL (ref 2.9–4.4)
Alpha-1-Globulin: 0.3 g/dL (ref 0.0–0.4)
Alpha-2-Globulin: 0.5 g/dL (ref 0.4–1.0)
BETA GLOBULIN: 0.8 g/dL (ref 0.7–1.3)
GAMMA GLOBULIN: 0.7 g/dL (ref 0.4–1.8)
Globulin, Total: 2.3 g/dL (ref 2.2–3.9)
Total Protein ELP: 5.6 g/dL — ABNORMAL LOW (ref 6.0–8.5)

## 2016-07-24 ENCOUNTER — Ambulatory Visit: Payer: Medicare Other

## 2016-08-03 NOTE — Care Management Important Message (Addendum)
Important Message  Patient Details  Name: Kathlynn GrateJean Scott Riffe MRN: [id number] Date of Birth: Oct 18, 1925   Medicare Important Message Given:       Jolee EwingRockett,Aileen Amore A, RN February 29, 2016, 2:56 PM

## 2016-08-03 NOTE — Progress Notes (Signed)
Spoke with Elizabeth Horne ME regarding pt's passing as she had fell PTA. After reviewing case with [name] he determined this is not an ME case and the medical doctor may sign off on death certificate.

## 2016-08-03 NOTE — Progress Notes (Signed)
Nurse called rapid response to patient room 159. Patient appeared to have agonal breathing after nurse and nt changed patient. Family had made patient DNR earlier in the day so did not call code blue.

## 2016-08-03 NOTE — Discharge Summary (Signed)
PATIENT NAME: Elizabeth AcreJean Kovatch    MR#:  [id number]  DATE OF BIRTH:  1925-12-06  DATE OF ADMISSION:  07/11/2016 ADMITTING PHYSICIAN: Tonye RoyaltyAlexis Hugelmeyer, DO  DATE OF DISCHARGE: 07/25/2016-patient deceased  PRIMARY CARE PHYSICIAN: Marikay AlarEric Sonnenberg, MD   ADMISSION DIAGNOSIS:  Right leg pain [M79.604] Injury of head, initial encounter [S09.90XA]  DISCHARGE DIAGNOSIS:  Acute respiratory failure Acute kidney failure Right leg hematoma status post drainage in the ED   SECONDARY DIAGNOSIS:   Past Medical History:  Diagnosis Date  . A-fib ([facility])   . Arthritis   . Hyperlipidemia   . Hypertension   . Subdural hematoma ([facility])   . Thyroid disease      ADMITTING HISTORY  HISTORY OF PRESENT ILLNESS: Elizabeth Horne is a 82 y.o. female with a known history of afib, HTN, HLD, hypothyroidism, subdural hematoma    presents to the emergency department complaining of mechanical fall.  Patient was in a usual state of health until this afternoon when she states she "just fell."  She denies dizziness, lightheadedness, shortness of breath, weakness, chest pain, loss of consciousness.  Patient states that she did hit her head as well as her right elbow and hip.  In the annulus patient received fentanyl with a brief period of apnea. She is then receive Narcan which corrected her respirations. In the emergency department patient was noted to have a right lower leg hematoma which was incised and drained by emergency department physician. At present her only complaint is leg pain.  Of note patient lives independently but does not drive. She states that her sons live nearby and check on her frequently.  Otherwise there has been no change in status. Patient has been taking medication as prescribed and there has been no recent change in medication or diet.  There has been no recent illness, travel or sick contacts.    Patient denies fevers/chills, weakness, dizziness, chest pain, shortness of breath, N/V/C/D, abdominal  pain, dysuria/frequency, changes in mental status.   HOSPITAL COURSE:   This is a 82 y.o.femalewith a history of afib, HTN, HLD, hypothyroidism, subdural hematoma.Please review history and physical for details    hospital course    1. Right leg pain  hematoma status post mechanical fall.  Drop in Hemoglobin initially-11.6-10.1-9.5 and 9.6, stable at this time start iron tablets with Colace InD done in ED.Evaluated by surgery Dr. Orvis BrillLoflin who has no other recommendations She has recommend a kerlex and ace wrap to remain on these areas changed once a day to once every other day.  The steristrips placed on the lanced area will fall off in a couple weeks  Acute blood loss anemia. No need for transfusion  Patient can be discharged to skilled nursing facility tomorrow there is no further bleeding and hemoglobin stable.  2. AKI -gentle IV fluid hydration provided  Creatinine 1.90-2.26 renal function is getting worse    decreased by mouthfluid intake   3.  hyperlipidemia-continue Lipitor  4. hypertension-continue Lasix  5.  hypothyroidism-continue Synthroid  6. GERD-continue Prilosec  7. Atrial fibrillation-chronic  Patient is started on small dose of metoprolol 12.5 mg by mouth twice a day for rate control Monitor on telemetry. Aspirin and Plavix held.    on November 12 patient is more altered and renal function was declining with poor by mouth intake .have discussed with the son and daughter for the healthcare power of attorney agreeable with the DO NOT RESUSCITATE and thinking about comfort care   he presented to the patient's clinical  condition is declining drastically   patient was unresponsive and RN noticed a bundle breathing at around 5 PM. Patient was placed on oxygen and eventually morphine was given for comfort. Nurse found no heart rate and 2nd RN verified no heart rate. Patient was pronounced dead

## 2016-08-03 NOTE — Significant Event (Signed)
Rapid Response Event Note  Overview: called to rapid response in room 159      Initial Focused Assessment: pt lying in bed agonal breathing, DNR in place   Interventions: spoke with Dr. Letitia LibraJohnston who ordered an increase in morphine and nurse may pronounce order... Family informed and at bedside  Plan of Care (if not transferred):  Event Summary:    at      at          [facility] LLC[facility]ay[facility],Elizabeth Horne

## 2016-08-03 NOTE — Progress Notes (Signed)
Rapid response came into patient's room. Patient was unresponsive and agonally breathing. Increased O2 to  6L oxygen nasal canula. RT changed to rebreather. Dr. Gouru paged by nurse, no response. Elizabeth LeatherwoodAmado CoeKatherine RN called Dr. Letitia Horne and received orders for 2mg  morphine q2h for comfort. Dr ordered RN to prounounce death. Rapid response team returned to their departments. Son and daughter at bedside and asked for a moment alone with patient.   Call from CCMD to notify RN of asystole.  Nurse found no heart rate and 2nd RN verified no heart rate. Supervisor notified (see note) for possible ME case.  Called [facility].

## 2016-08-03 NOTE — Progress Notes (Signed)
Patient refused PO meds this morning & unable to follow directions for inhaler.

## 2016-08-03 NOTE — Progress Notes (Addendum)
[facility] [place] [facility]   PATIENT NAME: Elizabeth Horne    MR#:  [id number]  DATE OF BIRTH:  [birth year]  SUBJECTIVE:  CHIEF COMPLAINT:  Patient is Altered today with no bymouth intake according to the son and daughter at bedside. No other bleeding episodes from the leg  REVIEW OF SYSTEMS:  Review of systems unobtainable  DRUG ALLERGIES:   Allergies  Allergen Reactions  . Tramadol Nausea Only    VITALS:  Blood pressure (!) 98/50, pulse (!) 105, temperature 98.4 F (36.9 C), temperature source Axillary, resp. rate 18, height 5\' 2"  (1.575 m), weight 46.8 kg (103 lb 1.6 oz), SpO2 98 %.  PHYSICAL EXAMINATION:  GENERAL:  82 y.o.-year-old patient lying in the bed with no acute distress.  EYES: Pupils equal, round, reactive to light and accommodation. No scleral icterus. Extraocular muscles intact.  HEENT: Head atraumatic, normocephalic. Oropharynx and nasopharynx clear.  NECK:  Supple, no jugular venous distention. No thyroid enlargement, no tenderness.  LUNGS: Normal breath sounds bilaterally, no wheezing, rales,rhonchi or crepitation. No use of accessory muscles of respiration.  CARDIOVASCULAR: S1, S2 normal. No murmurs, rubs, or gallops.  ABDOMEN: Soft, nontender, nondistended. Bowel sounds present. No organomegaly or mass.  EXTREMITIES: Right lower leg and the pressure bandage No pedal edema, cyanosis, or clubbing.  NEUROLOGIC: Patient is altered Gait not checked.  PSYCHIATRIC: The patient is disoriented SKIN: No obvious rash, lesion, or ulcer.    LABORATORY PANEL:   CBC  Recent Labs Lab 2016/02/19 0400  WBC 10.7  HGB 9.9*  HCT 31.1*  PLT 148*   ------------------------------------------------------------------------------------------------------------------  Chemistries   Recent Labs Lab 07/08/2016 2150  2016/02/19 0400  NA 139  < > 141  K 4.2  < > 5.5*  CL 103  < > 109  CO2 24  < > 24  GLUCOSE 110*  < > 108*  BUN 43*  < > 62*   CREATININE 1.59*  < > 2.83*  CALCIUM 8.7*  < > 8.4*  AST 38  --   --   ALT 28  --   --   ALKPHOS 99  --   --   BILITOT 0.5  --   --   < > = values in this interval not displayed. ------------------------------------------------------------------------------------------------------------------  Cardiac Enzymes No results for input(s): TROPONINI in the last 168 hours. ------------------------------------------------------------------------------------------------------------------  RADIOLOGY:  [place] Renal  Result Date: 07/14/2016 CLINICAL DATA:  Acute renal failure. EXAM: RENAL / URINARY TRACT ULTRASOUND COMPLETE COMPARISON:  CT abdomen pelvis 11/07/2015. FINDINGS: Right Kidney: Length: 8.1 cm. Renal cortical thinning. Increased renal cortical echogenicity. There is a 2.8 x 1.8 x 2.1 cm cyst within the inferior pole of the right kidney. No hydronephrosis. Left Kidney: Length: 9.1 cm. Cortical thinning. Increased renal cortical echogenicity. No hydronephrosis. Within the superior pole of the left kidney there is a 1.9 x 1.7 x 1.9 cm cyst. Additionally within the interpolar region of the left kidney there is a 1.9 x 2.5 x 2.1 cm cyst. Bladder: Appears normal for degree of bladder distention. IMPRESSION: No hydronephrosis. Bilateral renal cystic lesions. Increased renal cortical echogenicity as can be seen with chronic medical renal disease. Electronically Signed   By: Annia Beltrew  Davis M.D.   On: 07/14/2016 14:44    EKG:   Orders placed or performed during the hospital encounter of 07/19/2016  . EKG 12-Lead  . EKG 12-Lead  . EKG 12-Lead  . EKG 12-Lead    ASSESSMENT AND PLAN:  This is a 82 y.o.femalewith a history of afib, HTN, HLD, hypothyroidism, subdural hematomanow being admitted with  1.  AKI -Worsening Prerenal with poor by mouth intake  Creatinine 1.1-1.59 1.90-2.26-2.83 Hold Lasix Nephrology consult repeat labs in a.m. Hydrate with IV fluids Encourage by mouth fluid  intake Renal ultrasound with no hydronephrosis  Hyperkalemia Kayexalate rectally as patient is lethargic repeat potassium   2. Right leg pain  hematoma status post mechanical fall.  Drop in Hemoglobin initially-11.6-10.1-9.5 and 9.6, stable at this time start iron tablets with Colace InD done in ED.Evaluated by surgery Dr. Orvis BrillLoflin who has no other recommendations She has recommend a kerlex and ace wrap to remain on these areas changed once a day to once every other day.  The steristrips placed on the lanced area will fall off in a couple weeks Acute blood loss anemia. No need for transfusion    3.  hyperlipidemia-continue Lipitor  4. hypertension-continue Lasix  5.  hypothyroidism-continue Synthroid  6. GERD-continue Prilosec  7. Atrial fibrillation-chronic  Patient is started on small dose of metoprolol 12.5 mg by mouth twice a day for rate control Monitor on telemetry.  Aspirin and Plavix held. Can be restarted in 3 days after discharge if no further bleeding noticed. Currently patient is altered   Poor prognosis, clinically declining rapidly. CODE STATUS has been changed to DO NOT RESUSCITATE after discussing with the son and daughter for the healthcare power of attorney. Palliative care is consulted   All the records are reviewed and case discussed with Care Management/Social Workerr. Management plans discussed with the patient, son and daughter and they are in agreement.  CODE STATUS: fc, son and daughter - hcpoa  TOTAL TIME TAKING CARE OF THIS PATIENT: 36minutes.   POSSIBLE D/C IN 1-2 DAYS, DEPENDING ON CLINICAL CONDITION.  Note: This dictation was prepared with Dragon dictation along with smaller phrase technology. Any transcriptional errors that result from this process are unintentional.   Ramonita LabGouru, Taysom Glymph M.D on 2015/11/13 at 11:28 AM  Between 7am to 6pm - Pager - [phone] After 6pm go to [email or web address] - password [email or web address] [email or web address]  [facility]   Office  [phone]  CC: Primary care physician; Marikay AlarEric Sonnenberg, MD

## 2016-08-03 NOTE — Progress Notes (Signed)
Family Meeting Note  Advance Directive:yes  Today a meeting took place with the Son and daughter who are healthcare power of attorney.  Patient is unable to participate due ZO:XWRUEAto:Lacked capacity Altered mental status   The following clinical team members were present during this meeting:MD  The following were discussed:Patient's diagnosis: , Patient's progosis: < 12 months and Goals for treatment: DNR, and a palliative care consult placed  Additional follow-up to be provided: Hospitalist team, nephrology and palliative care  Time spent during discussion:16 min  Elizabeth Horne, Deanna ArtisAruna, MD

## 2016-08-03 NOTE — Progress Notes (Signed)
[facility]  ROUNDING NOTE   Subjective:  Patient quite confused today. Renal function worsening. Creatinine up to 2.83 despite IV fluid hydration.   Objective:  Vital signs in last 24 hours:  Temp:  [97.5 F (36.4 C)-98.4 F (36.9 C)] 97.5 F (36.4 C) (11/12 1159) Pulse Rate:  [101-120] 101 (11/12 1159) Resp:  [16-18] 18 (11/12 1159) BP: (98-154)/(39-85) 154/39 (11/12 1159) SpO2:  [93 %-98 %] 93 % (11/12 1159)  Weight change:  Filed Weights   07/30/2016 2043 07/13/16 0240 07/13/16 0800  Weight: 40.8 kg (90 lb) 44 kg (97 lb) 46.8 kg (103 lb 1.6 oz)    Intake/Output: I/O last 3 completed shifts: In: 4403 [P.O.:580; I.V.:775] Out: 300 [Urine:300]   Intake/Output this shift:  No intake/output data recorded.  Physical Exam: General: No acute distress. Laying in bed  Head: Normocephalic, atraumatic. Moist oral mucosal membranes  Eyes: Anicteric  Neck: Supple, trachea midline  Lungs:  Clear to auscultation, normal effort  Heart: S1S2 no rubs  Abdomen:  Soft, nontender, bowel sounds present  Extremities: trace peripheral edema, right lower extremity wrapped  Neurologic: Arousable, confused  Skin: No lesions       Basic Metabolic Panel:  Recent Labs Lab 07/22/2016 2150 07/13/16 0325 07/14/16 0415 2016-08-02 0400  NA 139 140 141 141  K 4.2 4.5 4.9 5.5*  CL 103 104 108 109  CO2 24 28 28 24   GLUCOSE 110* 114* 122* 108*  BUN 43* 47* 51* 62*  CREATININE 1.59* 1.90* 2.26* 2.83*  CALCIUM 8.7* 8.4* 8.3* 8.4*    Liver Function Tests:  Recent Labs Lab 07/18/2016 2150  AST 38  ALT 28  ALKPHOS 99  BILITOT 0.5  PROT 5.9*  ALBUMIN 3.5   No results for input(s): LIPASE, AMYLASE in the last 168 hours. No results for input(s): AMMONIA in the last 168 hours.  CBC:  Recent Labs Lab 07/25/2016 2213 07/13/16 0325 07/13/16 1206 07/14/16 0415 08/02/2016 0400  WBC 17.9* 15.9*  --   --  10.7  HGB 11.6* 10.1* 9.5* 9.6* 9.9*  HCT 35.4 30.6*  --   --  31.1*   MCV 95.9 96.0  --   --  101.6*  PLT 189 161  --   --  148*    Cardiac Enzymes:  Recent Labs Lab 07/13/16 0325  CKTOTAL 77    BNP: Invalid input(s): POCBNP  CBG: No results for input(s): GLUCAP in the last 168 hours.  Microbiology: Results for orders placed or performed in visit on 12/18/11  Urine culture     Status: None   Collection Time: 12/18/11  9:10 PM  Result Value Ref Range Status   Micro Text Report   Final       SOURCE: CLEAN CATCH    COMMENT                   NO GROWTH IN 36 HOURS   ANTIBIOTIC                                                        Coagulation Studies: No results for input(s): LABPROT, INR in the last 72 hours.  Urinalysis: No results for input(s): COLORURINE, LABSPEC, PHURINE, GLUCOSEU, HGBUR, BILIRUBINUR, KETONESUR, PROTEINUR, UROBILINOGEN, NITRITE, LEUKOCYTESUR in the last 72 hours.  Invalid input(s): APPERANCEUR  Imaging: US Renal  Result Date: 07/14/2016 CLINICAL DATA:  Acute renal failure. EXAM: RENAL / URINARY TRACT ULTRASOUND COMPLETE COMPARISON:  CT abdomen pelvis 11/07/2015. FINDINGS: Right Kidney: Length: 8.1 cm. Renal cortical thinning. Increased renal cortical echogenicity. There is a 2.8 x 1.8 x 2.1 cm cyst within the inferior pole of the right kidney. No hydronephrosis. Left Kidney: Length: 9.1 cm. Cortical thinning. Increased renal cortical echogenicity. No hydronephrosis. Within the superior pole of the left kidney there is a 1.9 x 1.7 x 1.9 cm cyst. Additionally within the interpolar region of the left kidney there is a 1.9 x 2.5 x 2.1 cm cyst. Bladder: Appears normal for degree of bladder distention. IMPRESSION: No hydronephrosis. Bilateral renal cystic lesions. Increased renal cortical echogenicity as can be seen with chronic medical renal disease. Electronically Signed   By: Lovey Newcomer M.D.   On: 07/14/2016 14:44     Medications:    . atorvastatin  40 mg Oral q1800  . cholecalciferol  2,000 Units Oral Daily  .  levothyroxine  100 mcg Oral QAC breakfast  . metoprolol tartrate  12.5 mg Oral BID  . mometasone-formoterol  2 puff Inhalation BID  .  morphine injection  2 mg Intravenous Once  . pantoprazole  40 mg Oral QAC breakfast  . sodium chloride flush  3 mL Intravenous Q12H   acetaminophen **OR** acetaminophen, bisacodyl, levalbuterol, magnesium citrate, morphine injection, ondansetron **OR** ondansetron (ZOFRAN) IV, oxyCODONE, senna-docusate  Assessment/ Plan:  82 y.o. female with a PMHx of Hypertension, hyperlipidemia, hypothyroidism, history of subdural hematoma, atrial fibrillation who was admitted to [facility] on 07/29/2016 for evaluation of fall. She was admitted subsequently as she was found to have right lower extremity hematoma which was evacuated by emergency department physician  1.  Acute renal failure, suspect due to inadequate PO intake and lasix use. 2.  CKD stage III baseline egfr 42. 3.  Anemia of CKD.  4.  Hypertension:  Plan:  Unfortunately renal function continues to worsen. BUN up to 62 with a creatinine of 2.8. For now continue IV fluid hydration. No acute indication for dialysis. We recommend continued monitoring of renal function over the next several days. Agree with palliative care consultation. We will continue to monitor along with you.   LOS: 2 Elizabeth Horne Nov 15, 20171:35 PM

## 2016-08-03 NOTE — Progress Notes (Signed)
Pt receiving care from Rapid Response Team. Two family members waiting in hall. CH took family to room. CH is available.   19-May-2016 1510  Clinical Encounter Type  Visited With Patient;Family  Visit Type Initial  Referral From Nurse  Spiritual Encounters  Spiritual Needs Emotional  Stress Factors  Patient Stress Factors None identified

## 2016-08-03 DEATH — deceased

## 2016-08-08 ENCOUNTER — Ambulatory Visit: Payer: Medicare Other | Admitting: Family Medicine

## 2016-08-09 ENCOUNTER — Ambulatory Visit: Payer: Medicare Other | Admitting: Family Medicine

## 2016-08-23 ENCOUNTER — Ambulatory Visit: Payer: Medicare Other

## 2016-12-24 IMAGING — CT CT ABD-PELV W/ CM
2 of 5 series · 16 of 46 positions shown, 18 images · IV contrast (omnipaque)
Comparison: None.

CLINICAL DATA: Weight loss and bloody stools

EXAM:
CT ABDOMEN AND PELVIS WITH CONTRAST
TECHNIQUE: Multidetector CT imaging of the abdomen and pelvis was performed
using the standard protocol following bolus administration of
intravenous contrast.
CONTRAST:  75mL OMNIPAQUE IOHEXOL 300 MG/ML  SOLN

[Series 2: routine abd pel with · axial · 0.64mm/px · z∈[-776,-482]mm · 13 of 67 slices shown, 15 images]
[im 4/67  soft-tissue]
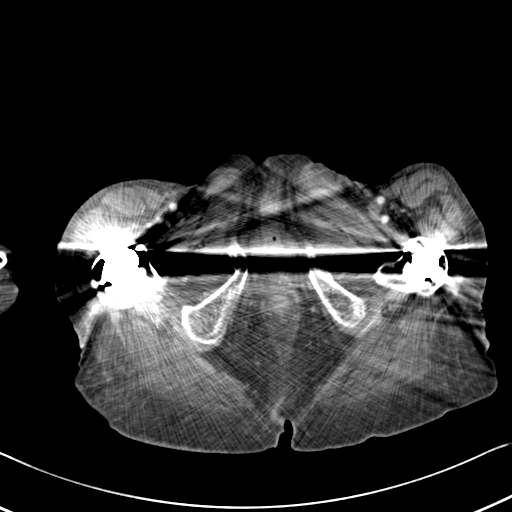
[im 4/67  bone]
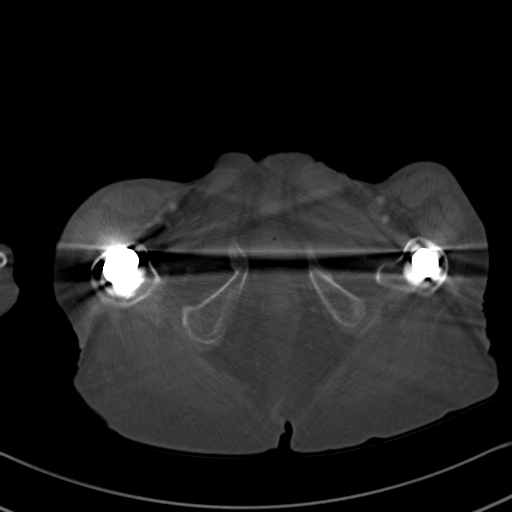
[im 11/67  soft-tissue]
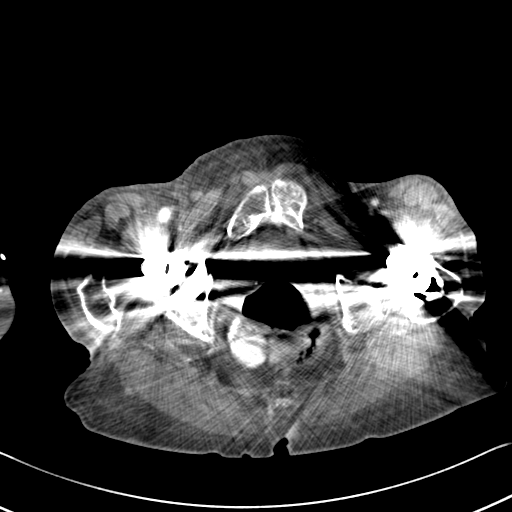
[im 18/67  soft-tissue]
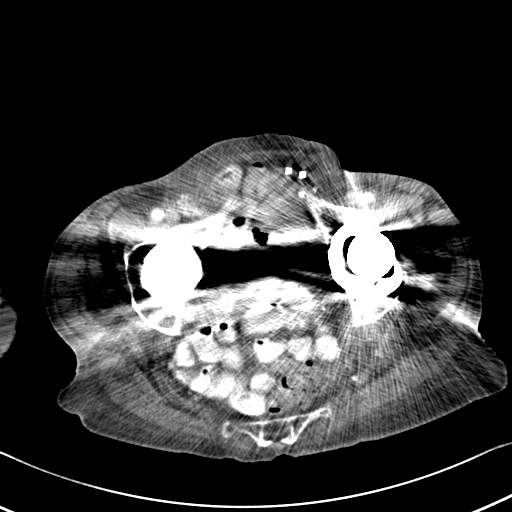
[im 21/67  soft-tissue]
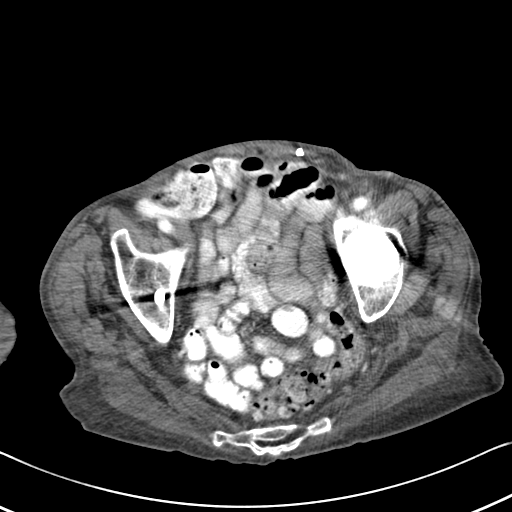
[im 25/67  soft-tissue]
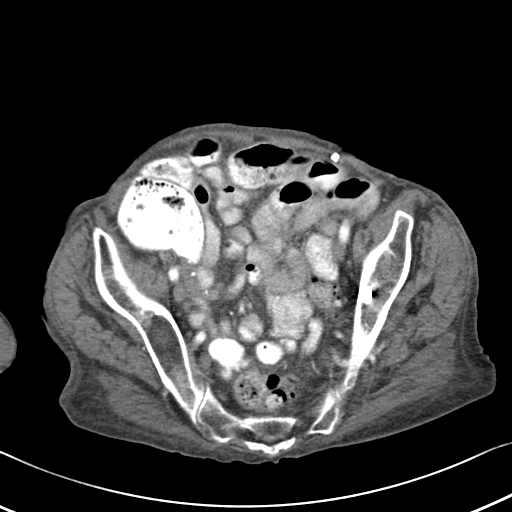
[im 32/67  soft-tissue]
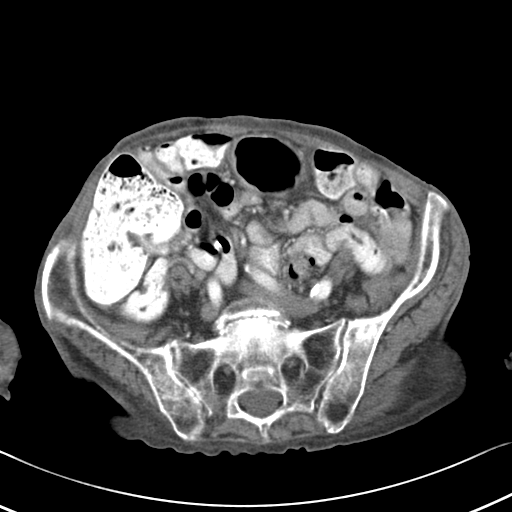
[im 35/67  soft-tissue]
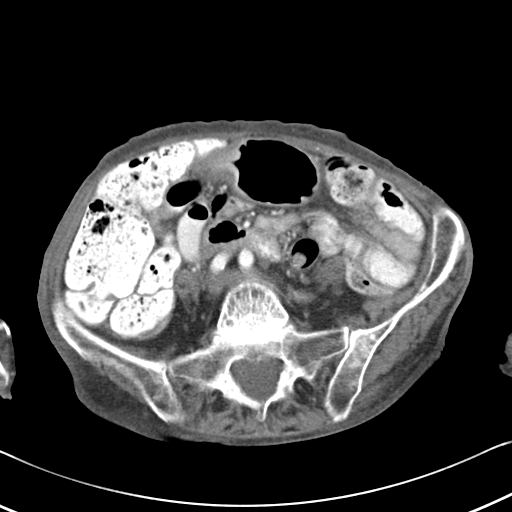
[im 39/67  soft-tissue]
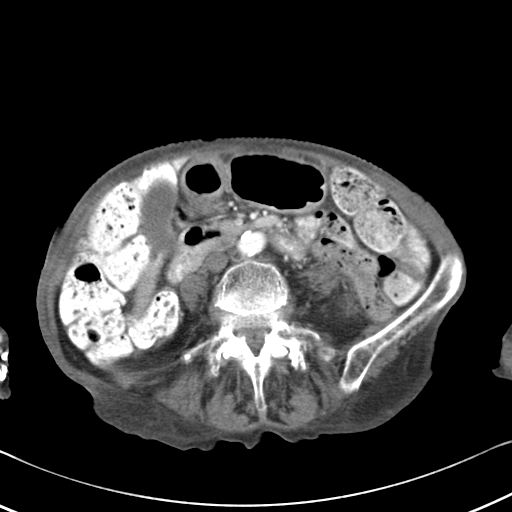
[im 46/67  soft-tissue]
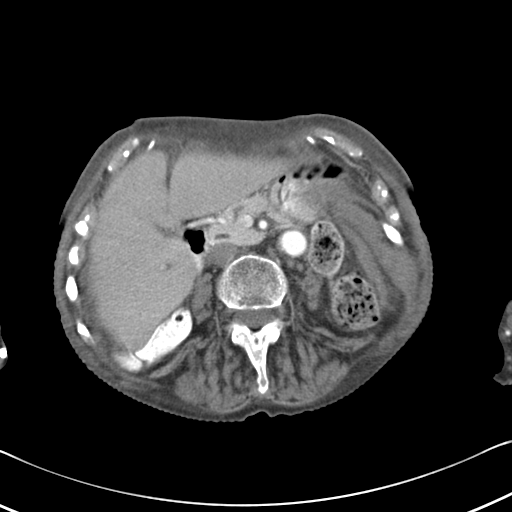
[im 46/67  bone]
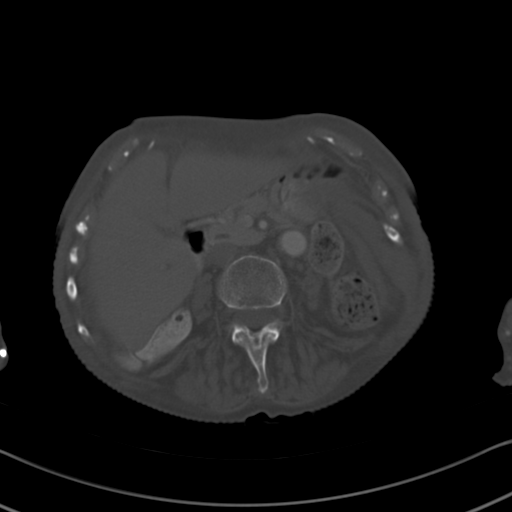
[im 49/67  soft-tissue]
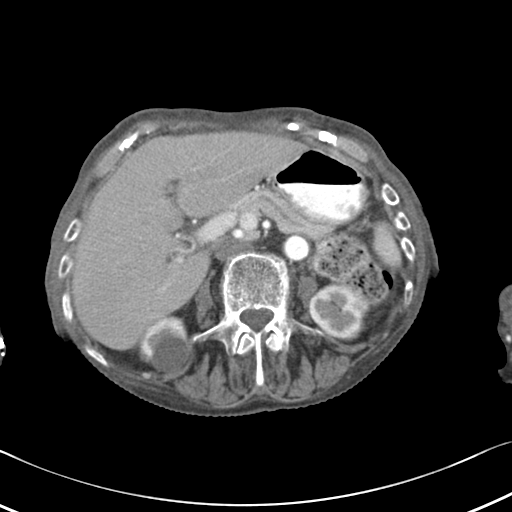
[im 53/67  soft-tissue]
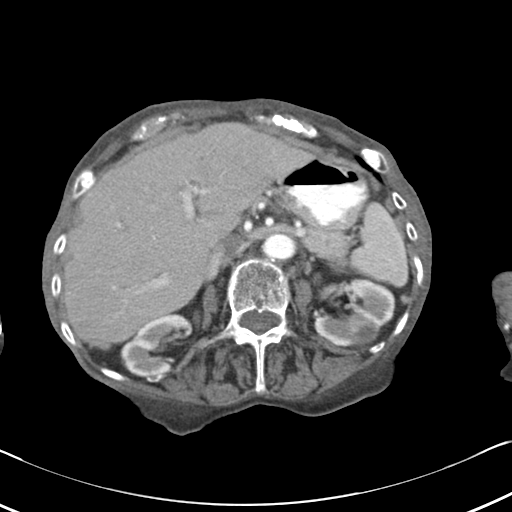
[im 60/67  soft-tissue]
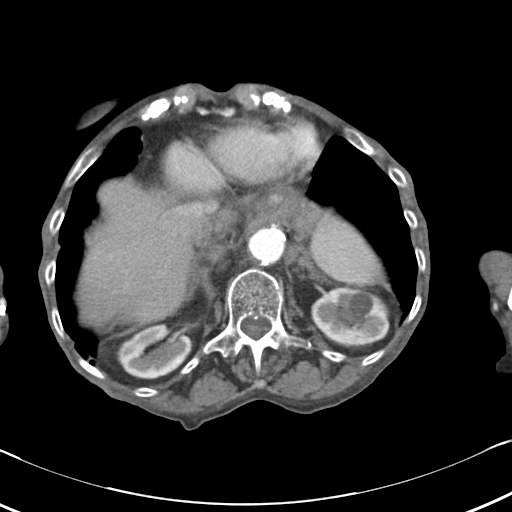
[im 63/67  soft-tissue]
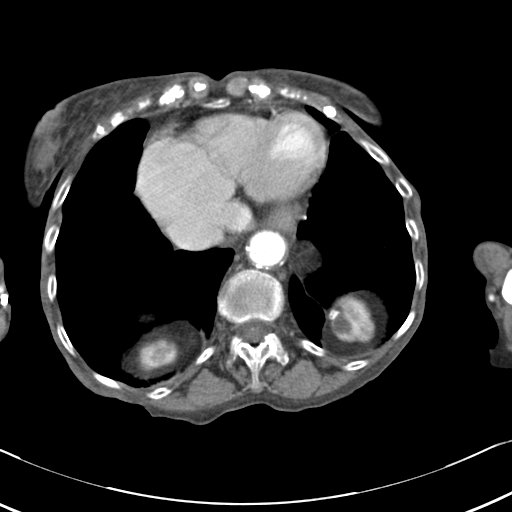

[Series 6: cor routine abd pel with · coronal · 0.54mm/px · 3 of 124 slices shown]
[im 42/124  soft-tissue]
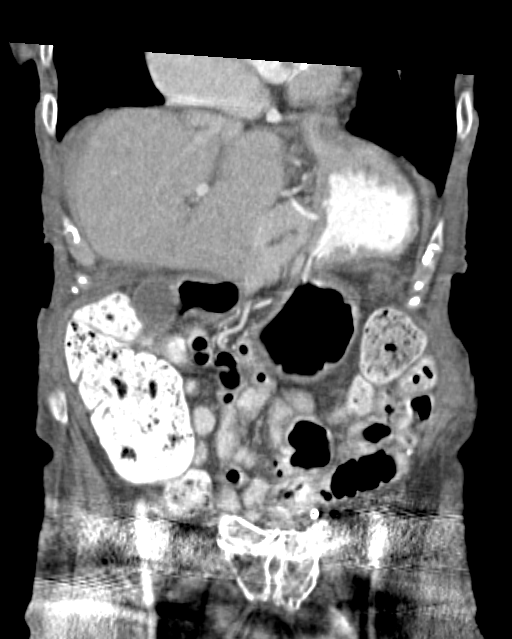
[im 55/124  soft-tissue]
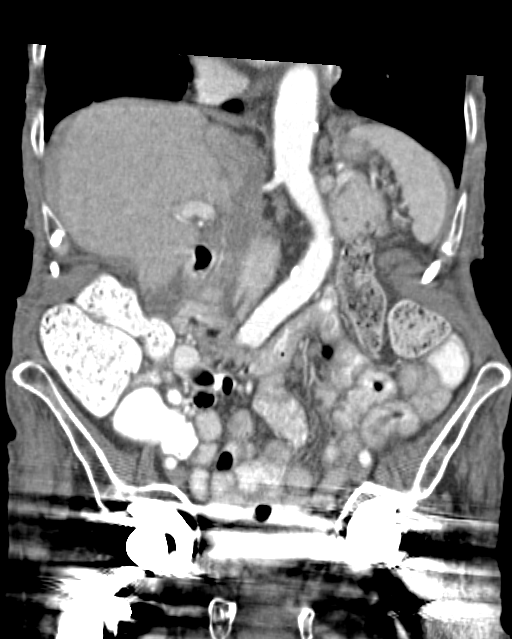
[im 69/124  soft-tissue]
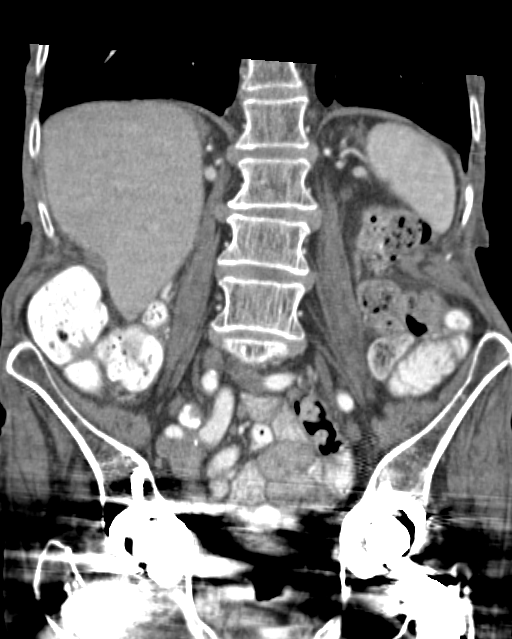

[16 of 46 positions shown; findings below may reference images not displayed]

FINDINGS: Lung bases are free of acute infiltrate or sizable effusion.

The liver, gallbladder, spleen, adrenal glands and pancreas are
within normal limits. Kidneys are well visualized bilaterally and
reveal cortical thinning and cystic change bilaterally. No calculi
or obstructive changes are seen.

Bilateral hip replacements are noted which precluded adequate
evaluation of the pelvic structures. The bladder is not visualized
due to the scatter artifact. The appendix is within normal limits.
Diverticulosis is noted without evidence of diverticulitis.
Aortoiliac calcifications are noted without aneurysmal dilatation.
The osseous structures show degenerative change of the lumbar spine.
Old pelvic pubic rami fractures are noted.
IMPRESSION: No acute abnormality is identified to correspond with the patient's
given clinical history.

## 2016-12-27 ENCOUNTER — Telehealth: Payer: Self-pay | Admitting: Family Medicine

## 2016-12-27 NOTE — Telephone Encounter (Signed)
Deceased pt Elizabeth Horne daughter called about the company will be faxing ADL report in order for the long term benefit claim to be paid. It's called [employer].Information needed from dates March 5-July 23. They are looking for her need to have needed home health. fax number is [phone].   Daughter number is [phone]. Thank you!

## 2016-12-28 NOTE — Telephone Encounter (Signed)
Please advise 

## 2016-12-28 NOTE — Telephone Encounter (Signed)
Noted. We will await the arrival of these forms.

## 2017-01-03 ENCOUNTER — Telehealth: Payer: Self-pay | Admitting: Family Medicine

## 2017-01-03 NOTE — Telephone Encounter (Signed)
Please advise 

## 2017-01-03 NOTE — Telephone Encounter (Signed)
Attempted to contact patient's daughter, Misty StanleyLisa, at the number provided in the prior phone note regarding the [employer] forms to be filled out. I have questions regarding what needs to be filled in. I left a message asking her to call back to our office. We will await her call back.

## 2017-01-03 NOTE — Telephone Encounter (Signed)
Pt's daughter Misty StanleyLisa called back returning your call. Please advise, thank you!  Call LigonierLisa @ [phone]

## 2017-01-03 NOTE — Telephone Encounter (Signed)
noted 

## 2017-01-04 NOTE — Telephone Encounter (Signed)
Form completed.  Please fax.

## 2017-01-04 NOTE — Telephone Encounter (Signed)
faxed

## 2017-03-15 ENCOUNTER — Ambulatory Visit: Payer: Medicare Other
